# Patient Record
Sex: Male | Born: 1990 | Race: White | Hispanic: No | Marital: Married | Smoking: Current every day smoker
Health system: Southern US, Community
[De-identification: ages and names within clinical notes are randomized; demographics above are authoritative.]

## PROBLEM LIST (undated history)

## (undated) DIAGNOSIS — E119 Type 2 diabetes mellitus without complications: Secondary | ICD-10-CM

## (undated) DIAGNOSIS — I10 Essential (primary) hypertension: Secondary | ICD-10-CM

## (undated) DIAGNOSIS — T8859XA Other complications of anesthesia, initial encounter: Secondary | ICD-10-CM

## (undated) DIAGNOSIS — F419 Anxiety disorder, unspecified: Secondary | ICD-10-CM

## (undated) DIAGNOSIS — H7290 Unspecified perforation of tympanic membrane, unspecified ear: Secondary | ICD-10-CM

## (undated) DIAGNOSIS — E039 Hypothyroidism, unspecified: Secondary | ICD-10-CM

## (undated) HISTORY — PX: DENTAL SURGERY: SHX609

## (undated) HISTORY — DX: Type 2 diabetes mellitus without complications: E11.9

---
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
## 2008-06-13 ENCOUNTER — Emergency Department (HOSPITAL_COMMUNITY): Admission: EM | Admit: 2008-06-13 | Discharge: 2008-06-13 | Payer: Self-pay | Admitting: Emergency Medicine

## 2011-06-30 ENCOUNTER — Encounter: Payer: Self-pay | Admitting: Emergency Medicine

## 2011-06-30 ENCOUNTER — Emergency Department (HOSPITAL_COMMUNITY)
Admission: EM | Admit: 2011-06-30 | Discharge: 2011-06-30 | Disposition: A | Discharge status: Home / Self Care | Payer: Self-pay | Attending: Emergency Medicine | Admitting: Emergency Medicine

## 2011-06-30 DIAGNOSIS — R51 Headache: Secondary | ICD-10-CM | POA: Insufficient documentation

## 2011-06-30 DIAGNOSIS — R110 Nausea: Secondary | ICD-10-CM | POA: Insufficient documentation

## 2011-06-30 DIAGNOSIS — F17200 Nicotine dependence, unspecified, uncomplicated: Secondary | ICD-10-CM | POA: Insufficient documentation

## 2011-06-30 DIAGNOSIS — H9209 Otalgia, unspecified ear: Secondary | ICD-10-CM | POA: Insufficient documentation

## 2011-06-30 DIAGNOSIS — J329 Chronic sinusitis, unspecified: Secondary | ICD-10-CM | POA: Insufficient documentation

## 2011-06-30 HISTORY — DX: Essential (primary) hypertension: I10

## 2011-06-30 MED ORDER — PREDNISONE 10 MG PO TABS: ORAL_TABLET | ORAL | Status: DC

## 2011-06-30 MED ORDER — PSEUDOEPHEDRINE HCL 60 MG PO TABS
60.0000 mg | ORAL_TABLET | Freq: Once | ORAL | Status: AC
  Administered 2011-06-30: 60 mg via ORAL
  Filled 2011-06-30: qty 1

## 2011-06-30 MED ORDER — PREDNISONE 20 MG PO TABS
60.0000 mg | ORAL_TABLET | Freq: Once | ORAL | Status: AC
  Administered 2011-06-30: 60 mg via ORAL
  Filled 2011-06-30: qty 3

## 2011-06-30 MED ORDER — PSEUDOEPHEDRINE HCL 60 MG PO TABS: 60.0000 mg | ORAL_TABLET | Freq: Four times a day (QID) | ORAL | Status: AC | PRN

## 2011-06-30 MED ORDER — HYDROCODONE-ACETAMINOPHEN 5-325 MG PO TABS
ORAL_TABLET | ORAL | Status: DC
Start: 2011-06-30 — End: 2013-04-07

## 2011-06-30 MED ORDER — AMOXICILLIN 500 MG PO CAPS: ORAL_CAPSULE | ORAL | Status: DC

## 2011-06-30 MED ORDER — PENICILLIN V POTASSIUM 250 MG PO TABS
500.0000 mg | ORAL_TABLET | Freq: Once | ORAL | Status: AC
  Administered 2011-06-30: 500 mg via ORAL
  Filled 2011-06-30: qty 2

## 2011-06-30 NOTE — ED Provider Notes (Signed)
History     CSN: [id number]  Arrival date & time 06/30/11  1007   None     Chief Complaint  Patient presents with  . Headache  . Otalgia    (Consider location/radiation/quality/duration/timing/severity/associated sxs/prior treatment) Patient is a 20 y.o. male presenting with headaches and ear pain. The history is provided by the patient.  Headache  This is a new problem. The current episode started more than 2 days ago. The problem occurs constantly. The problem has been gradually worsening. The headache is associated with nothing. The pain is located in the right unilateral region. The quality of the pain is described as sharp and throbbing. The pain is moderate. Associated symptoms include nausea. Pertinent negatives include no fever, no near-syncope, no palpitations and no shortness of breath. He has tried acetaminophen and NSAIDs for the symptoms. The treatment provided no relief.  Otalgia Associated symptoms include headaches. Pertinent negatives include no abdominal pain, no neck pain and no cough.    Past Medical History  Diagnosis Date  . Hypertension     History reviewed. No pertinent past surgical history.  History reviewed. No pertinent family history.  History  Substance Use Topics  . Smoking status: Current Everyday Smoker    Types: Cigarettes  . Smokeless tobacco: Not on file  . Alcohol Use: No      Review of Systems  Constitutional: Negative for fever and activity change.       All ROS Neg except as noted in HPI  HENT: Positive for ear pain. Negative for nosebleeds and neck pain.   Eyes: Negative for photophobia and discharge.  Respiratory: Negative for cough, shortness of breath and wheezing.   Cardiovascular: Negative for chest pain, palpitations and near-syncope.  Gastrointestinal: Positive for nausea. Negative for abdominal pain and blood in stool.  Genitourinary: Negative for dysuria, frequency and hematuria.  Musculoskeletal: Negative for back  pain and arthralgias.  Skin: Negative.   Neurological: Positive for headaches. Negative for dizziness, seizures and speech difficulty.  Psychiatric/Behavioral: Negative for hallucinations and confusion.    Allergies  Review of patient's allergies indicates no known allergies.  Home Medications   Current Outpatient Rx  Name Route Sig Dispense Refill  . ACETAMINOPHEN 500 MG PO TABS Oral Take 1,000 mg by mouth every 6 (six) hours as needed. For pain     . IBUPROFEN 200 MG PO TABS Oral Take 400 mg by mouth every 6 (six) hours as needed. For pain       BP 146/90  Pulse 88  Temp(Src) 98.2 F (36.8 C) (Oral)  Resp 18  Ht 6\' 2"  (1.88 m)  Wt 320 lb (145.151 kg)  BMI 41.09 kg/m2  SpO2 98%  Physical Exam  Nursing note and vitals reviewed. Constitutional: He is oriented to person, place, and time. He appears well-developed and well-nourished.  Non-toxic appearance.  HENT:  Head: Normocephalic.  Right Ear: Tympanic membrane and external ear normal.  Left Ear: Tympanic membrane and external ear normal.       Pain over the right frontal sinus. Mod nasal congestion  Eyes: EOM and lids are normal. Pupils are equal, round, and reactive to light.  Neck: Normal range of motion. Neck supple. Carotid bruit is not present.  Cardiovascular: Normal rate, regular rhythm, normal heart sounds, intact distal pulses and normal pulses.   Pulmonary/Chest: Breath sounds normal. No respiratory distress.  Abdominal: Soft. Bowel sounds are normal. There is no tenderness. There is no guarding.  Musculoskeletal: Normal range of motion.  Lymphadenopathy:       Head (right side): No submandibular adenopathy present.       Head (left side): No submandibular adenopathy present.    He has no cervical adenopathy.  Neurological: He is alert and oriented to person, place, and time. He has normal strength. No cranial nerve deficit or sensory deficit.  Skin: Skin is warm and dry.  Psychiatric: He has a normal mood  and affect. His speech is normal.    ED Course  Procedures (including critical care time)  Labs Reviewed - No data to display No results found.  Pulse oximetry 98% on room air. Within normal limits by my interpretation. Dx: Sinusitis  2. Headache   MDM  I have reviewed nursing notes, vital signs, and all appropriate lab and imaging results for this patient.   Patient complains of headache, right earache, and sinus congestion. His neurologic examination is well within normal limits. He has no temperature elevation pulse rate elevation or evidence of acute abscess in the area. Patient is treated for sinus infection. Prescription for amoxicillin 500 mg daily to some 10 mg, and Norco have been given. Patient to be referred to ear nose and throat physician if this is not effective.     Kathie Dike, [place] 06/30/11 [phone]

## 2011-06-30 NOTE — ED Notes (Signed)
Pt ambulated to BR with steady gate. NAD at this time.

## 2011-06-30 NOTE — ED Notes (Signed)
Pt a/ox4. Resp even and unlabored. NAD at this time. D/C instructions and Rx x4 reviewed with pt. Pt verbalized understanding. Pt ambulated to lobby with steady gate.

## 2011-06-30 NOTE — ED Notes (Signed)
Pt presents with right sided ear pain and headache x 3 days. Pt states he recently had a sinus infection. NAD at this time.

## 2011-06-30 NOTE — ED Notes (Signed)
Pt c/o left ear pain with head pain x 3 days.

## 2011-07-01 NOTE — ED Provider Notes (Signed)
Medical screening examination/treatment/procedure(s) were performed by non-physician practitioner and as supervising physician I was immediately available for consultation/collaboration.    Nelia Shi, MD 07/01/11 2227

## 2013-04-07 ENCOUNTER — Emergency Department (HOSPITAL_COMMUNITY)
Admission: EM | Admit: 2013-04-07 | Discharge: 2013-04-07 | Disposition: A | Discharge status: Home / Self Care | Payer: Self-pay | Attending: Emergency Medicine | Admitting: Emergency Medicine

## 2013-04-07 ENCOUNTER — Encounter (HOSPITAL_COMMUNITY): Payer: Self-pay

## 2013-04-07 DIAGNOSIS — F17200 Nicotine dependence, unspecified, uncomplicated: Secondary | ICD-10-CM | POA: Insufficient documentation

## 2013-04-07 DIAGNOSIS — I10 Essential (primary) hypertension: Secondary | ICD-10-CM | POA: Insufficient documentation

## 2013-04-07 DIAGNOSIS — L03012 Cellulitis of left finger: Secondary | ICD-10-CM

## 2013-04-07 DIAGNOSIS — L03019 Cellulitis of unspecified finger: Secondary | ICD-10-CM | POA: Insufficient documentation

## 2013-04-07 MED ORDER — SULFAMETHOXAZOLE-TRIMETHOPRIM 800-160 MG PO TABS: 1.0000 | ORAL_TABLET | Freq: Two times a day (BID) | ORAL | Status: DC

## 2013-04-07 MED ORDER — LIDOCAINE HCL (PF) 1 % IJ SOLN
INTRAMUSCULAR | Status: AC
  Administered 2013-04-07: 13:00:00 via SUBCUTANEOUS
  Filled 2013-04-07: qty 5

## 2013-04-07 MED ORDER — LIDOCAINE HCL (PF) 1 % IJ SOLN
INTRAMUSCULAR | Status: AC
  Filled 2013-04-07: qty 5

## 2013-04-07 MED ORDER — HYDROCODONE-ACETAMINOPHEN 5-325 MG PO TABS: 1.0000 | ORAL_TABLET | ORAL | Status: DC | PRN

## 2013-04-07 NOTE — ED Provider Notes (Signed)
Medical screening examination/treatment/procedure(s) were performed by non-physician practitioner and as supervising physician I was immediately available for consultation/collaboration.  Case discussed with me.  Shelda Jakes, MD 04/07/13 [phone]

## 2013-04-07 NOTE — ED Notes (Signed)
Pt reports redness and swelling to the end of his left index finger for the past 2 days.  Pt reports severe pain to the area.  Pt denies any fever.

## 2013-04-07 NOTE — ED Provider Notes (Signed)
CSN: [id number]     Arrival date & time 04/07/13  1016 History   First MD Initiated Contact with Patient 04/07/13 1051     Chief Complaint  Patient presents with  . Hand Pain   (Consider location/radiation/quality/duration/timing/severity/associated sxs/prior Treatment) Patient is a 22 y.o. male presenting with hand pain. The history is provided by the patient.  Hand Pain This is a new problem. The current episode started yesterday. The problem occurs constantly. The problem has been gradually worsening. Pertinent negatives include no chills, fever, nausea, neck pain or vomiting. He has tried nothing for the symptoms.   Shawn Griffith is a 22 y.o. male who presents to the ED with pain, redness and swelling to the left index finger. He bites his nails and got a hang nail that got infected. Over the past 2 days the area has gotten much worse and is very painful. He denies fever or other problems.  Past Medical History  Diagnosis Date  . Hypertension    History reviewed. No pertinent past surgical history. No family history on file. History  Substance Use Topics  . Smoking status: Current Every Day Smoker    Types: Cigarettes  . Smokeless tobacco: Not on file  . Alcohol Use: No    Review of Systems  Constitutional: Negative for fever and chills.  HENT: Negative for neck pain.   Gastrointestinal: Negative for nausea and vomiting.  Musculoskeletal:       Left index finger swollen, red and tender.  Skin: Positive for wound.  Allergic/Immunologic: Negative for immunocompromised state.  Psychiatric/Behavioral: The patient is not nervous/anxious.     Allergies  Review of patient's allergies indicates no known allergies.  Home Medications   Current Outpatient Rx  Name  Route  Sig  Dispense  Refill  . acetaminophen (TYLENOL) 500 MG tablet   Oral   Take 1,000 mg by mouth every 6 (six) hours as needed. For pain           BP 154/79  Pulse 64  Temp(Src) 98.2 F (36.8 C)  (Oral)  Resp 16  SpO2 100% Physical Exam  Nursing note and vitals reviewed. Constitutional: He is oriented to person, place, and time. He appears well-developed and well-nourished. No distress.  HENT:  Head: Normocephalic.  Eyes: EOM are normal.  Neck: Neck supple.  Cardiovascular: Normal rate.   Pulmonary/Chest: Effort normal.  Musculoskeletal:       Left hand: He exhibits tenderness, decreased capillary refill and swelling. He exhibits normal range of motion, no deformity and no laceration. Normal sensation noted. Normal strength noted.       Hands: Neurological: He is alert and oriented to person, place, and time. No cranial nerve deficit.  Skin: Skin is warm and dry.  Psychiatric: He has a normal mood and affect. His behavior is normal.    ED Course  Procedures  INCISION AND DRAINAGE Performed by: NEESE,HOPE Consent: Verbal consent obtained. Risks and benefits: risks, benefits and alternatives were discussed Type: abscess  Body area: left index finger  Area cleaned with Betadine  Anesthesia: Digital block  Local anesthetic: lidocaine 1% without epinephrine and Sensorcaine .25 %  Anesthetic total: 4ml  Incision made with # 11 blade  Drainage: purulent  Drainage amount: small  Patient tolerance: Patient tolerated the procedure well with no immediate complications.    MDM  22 y.o. male with paronychia of left index finger, drained without difficulty. Patient to return in 2 days for recheck or sooner if symptoms worsen.  Patient stable for discharge home without any immediate complications. Area does not appear as a felon at this time. Will start antibiotics and pain medication.  Discussed with the patient and all questioned fully answered. He will return immediately  if any problems arise.    Medication List    TAKE these medications       HYDROcodone-acetaminophen 5-325 MG per tablet  Commonly known as:  NORCO/VICODIN  Take 1 tablet by mouth every 4 (four)  hours as needed.     sulfamethoxazole-trimethoprim 800-160 MG per tablet  Commonly known as:  SEPTRA DS  Take 1 tablet by mouth every 12 (twelve) hours.      ASK your doctor about these medications       acetaminophen 500 MG tablet  Commonly known as:  TYLENOL  Take 1,000 mg by mouth every 6 (six) hours as needed. For pain           Shawn Napoleon, NP 04/07/13 1735

## 2013-04-08 NOTE — Progress Notes (Signed)
ED/CM noted patient did not have health insurance and/or PCP listed in the computer.  Patient was given the [place] resource handout with information on the clinics, food pantries, and the handout for new health insurance sign-up.  Patient expressed appreciation for this. 

## 2013-12-25 ENCOUNTER — Encounter (HOSPITAL_COMMUNITY): Payer: Self-pay | Admitting: Emergency Medicine

## 2013-12-25 ENCOUNTER — Emergency Department (HOSPITAL_COMMUNITY)
Admission: EM | Admit: 2013-12-25 | Discharge: 2013-12-25 | Disposition: A | Discharge status: Home / Self Care | Payer: Self-pay | Attending: Emergency Medicine | Admitting: Emergency Medicine

## 2013-12-25 DIAGNOSIS — H6501 Acute serous otitis media, right ear: Secondary | ICD-10-CM

## 2013-12-25 DIAGNOSIS — R259 Unspecified abnormal involuntary movements: Secondary | ICD-10-CM | POA: Insufficient documentation

## 2013-12-25 DIAGNOSIS — I10 Essential (primary) hypertension: Secondary | ICD-10-CM | POA: Insufficient documentation

## 2013-12-25 DIAGNOSIS — J020 Streptococcal pharyngitis: Secondary | ICD-10-CM

## 2013-12-25 DIAGNOSIS — H6500 Acute serous otitis media, unspecified ear: Secondary | ICD-10-CM | POA: Insufficient documentation

## 2013-12-25 DIAGNOSIS — F17200 Nicotine dependence, unspecified, uncomplicated: Secondary | ICD-10-CM | POA: Insufficient documentation

## 2013-12-25 DIAGNOSIS — J029 Acute pharyngitis, unspecified: Secondary | ICD-10-CM | POA: Insufficient documentation

## 2013-12-25 HISTORY — DX: Unspecified perforation of tympanic membrane, unspecified ear: H72.90

## 2013-12-25 LAB — RAPID STREP SCREEN (MED CTR MEBANE ONLY): STREPTOCOCCUS, GROUP A SCREEN (DIRECT): POSITIVE — AB

## 2013-12-25 MED ORDER — HYDROCODONE-ACETAMINOPHEN 5-325 MG PO TABS: 1.0000 | ORAL_TABLET | ORAL | Status: DC | PRN

## 2013-12-25 MED ORDER — HYDROCODONE-ACETAMINOPHEN 5-325 MG PO TABS
1.0000 | ORAL_TABLET | Freq: Once | ORAL | Status: AC
  Administered 2013-12-25: 1 via ORAL
  Filled 2013-12-25: qty 1

## 2013-12-25 MED ORDER — DEXAMETHASONE SODIUM PHOSPHATE 4 MG/ML IJ SOLN
8.0000 mg | Freq: Once | INTRAMUSCULAR | Status: AC
  Administered 2013-12-25: 8 mg via INTRAVENOUS
  Filled 2013-12-25: qty 2

## 2013-12-25 MED ORDER — PENICILLIN G BENZATHINE 1200000 UNIT/2ML IM SUSP
1.2000 10*6.[IU] | Freq: Once | INTRAMUSCULAR | Status: AC
  Administered 2013-12-25: 1.2 10*6.[IU] via INTRAMUSCULAR
  Filled 2013-12-25: qty 2

## 2013-12-25 NOTE — ED Provider Notes (Signed)
CSN: [id number]     Arrival date & time 12/25/13  1530 History   First MD Initiated Contact with Patient 12/25/13 1547     Chief Complaint  Patient presents with  . Sore Throat  . Otalgia     (Consider location/radiation/quality/duration/timing/severity/associated sxs/prior Treatment) Patient is a 23 y.o. male presenting with pharyngitis and ear pain. The history is provided by the patient.  Sore Throat This is a new problem. The current episode started in the past 7 days. The problem occurs constantly. The problem has been gradually worsening. Associated symptoms include chills, headaches, myalgias, a sore throat and swollen glands. Pertinent negatives include no abdominal pain, chest pain, congestion, coughing, fever, nausea, rash, urinary symptoms, visual change or vomiting. He has tried acetaminophen for the symptoms. The treatment provided no relief.  Otalgia Associated symptoms: headaches and sore throat   Associated symptoms: no abdominal pain, no congestion, no cough, no diarrhea, no fever, no rash and no vomiting    Shawn Griffith is a 23 y.o. male who presents to the ED with sore throat and ear pain that started 3 days ago. The pain has gotten worse. The ear pain is on the right. He states it hurts so bad it makes his head hurt.   Past Medical History  Diagnosis Date  . Hypertension   . Ruptured ear drum     right   History reviewed. No pertinent past surgical history. History reviewed. No pertinent family history. History  Substance Use Topics  . Smoking status: Current Every Day Smoker -- 0.50 packs/day for 15 years    Types: Cigarettes  . Smokeless tobacco: Never Used  . Alcohol Use: No    Review of Systems  Constitutional: Positive for chills. Negative for fever.  HENT: Positive for ear pain and sore throat. Negative for congestion.   Eyes: Negative for visual disturbance.  Respiratory: Negative for cough.   Cardiovascular: Negative for chest pain.   Gastrointestinal: Negative for nausea, vomiting, abdominal pain and diarrhea.  Genitourinary: Negative for dysuria, urgency and frequency.  Musculoskeletal: Positive for myalgias.  Skin: Negative for rash.  Neurological: Positive for headaches.  Psychiatric/Behavioral: Negative for confusion. The patient is not nervous/anxious.       Allergies  Review of patient's allergies indicates no known allergies.  Home Medications   Prior to Admission medications   Medication Sig Start Date End Date Taking? Authorizing Miu Chiong  acetaminophen (TYLENOL) 500 MG tablet Take 1,000 mg by mouth every 6 (six) hours as needed. For pain     Historical Daishaun Ayre, MD  HYDROcodone-acetaminophen (NORCO/VICODIN) 5-325 MG per tablet Take 1 tablet by mouth every 4 (four) hours as needed. 04/07/13   Hope Orlene OchM Neese, NP  sulfamethoxazole-trimethoprim (SEPTRA DS) 800-160 MG per tablet Take 1 tablet by mouth every 12 (twelve) hours. 04/07/13   Hope Orlene OchM Neese, NP   BP 160/94  Pulse 107  Temp(Src) 98.1 F (36.7 C) (Oral)  Resp 20  Ht 6\' 3"  (1.905 m)  Wt 330 lb (149.687 kg)  BMI 41.25 kg/m2  SpO2 98% Physical Exam  Nursing note and vitals reviewed. Constitutional: He is oriented to person, place, and time. He appears well-developed and well-nourished.  HENT:  Head: Normocephalic.  Right Ear: Tympanic membrane is bulging.  Left Ear: Tympanic membrane normal.  Nose: Rhinorrhea present.  Mouth/Throat: Uvula is midline and mucous membranes are normal. Posterior oropharyngeal erythema present.  Tonsils enlarged and swollen, almost touching.   Eyes: EOM are normal.  Neck: Neck supple.  Cardiovascular: Regular rhythm.  Tachycardia present.   Pulmonary/Chest: Effort normal and breath sounds normal.  Abdominal: Soft. There is no tenderness.  Musculoskeletal: Normal range of motion.  Neurological: He is alert and oriented to person, place, and time. No cranial nerve deficit.  Skin: Skin is warm and dry.  Psychiatric:  He has a normal mood and affect. His behavior is normal.   Results for orders placed during the hospital encounter of 12/25/13 (from the past 24 hour(s))  RAPID STREP SCREEN     Status: Abnormal   Collection Time    12/25/13  3:45 PM      Result Value Ref Range   Streptococcus, Group A Screen (Direct) POSITIVE (*) NEGATIVE    ED Course  Procedures Decadron 8 mg IM, Bicillin 1.2 M/U IM MDM  23 y.o. male with sore throat, right ear pain and feeling bad x 3 days. Will treat for strep with Bicillin 1.2 million units IM, will give Decadron 8 mg. IM to help decrease the swelling of his tonsils. Will treat with pain medications. Offered patient liquid pain medication but he request pill because liquid medication makes him gag. Stable for discharge. Able to swallow pill without difficulty. I have reviewed this patient's vital signs, nurses notes, appropriate labs and discussed findings and plan of care with the patient and he voices understanding and agrees with plan.     [address]Hope BluewellM Neese, [place] 12/25/13 [id number]

## 2013-12-25 NOTE — ED Provider Notes (Signed)
Medical screening examination/treatment/procedure(s) were performed by non-physician practitioner and as supervising physician I was immediately available for consultation/collaboration.   EKG Interpretation None      Devoria AlbeIva Knapp, MD, Armando GangFACEP   Ward GivensIva L Knapp, MD 12/25/13 [phone]

## 2013-12-25 NOTE — ED Notes (Signed)
Pt c/o sore throat that "has spread to ears". Also c/o feeling like his throat is closing. Denies any fevers.

## 2013-12-25 NOTE — Discharge Instructions (Signed)
Take ibuprofen in addition to the narcotic pain medication. Return for worsening symptoms.

## 2013-12-25 NOTE — ED Notes (Signed)
Patient c/o sore throat x3 days progressively. Patient also c/o right ear ache. Patient states "It hurts so much it makes my head tender to touch." Patient denies any fevers or drainage from ear.

## 2017-08-10 ENCOUNTER — Emergency Department (HOSPITAL_COMMUNITY)
Admission: EM | Admit: 2017-08-10 | Discharge: 2017-08-10 | Disposition: A | Discharge status: Home / Self Care | Payer: Self-pay | Attending: Emergency Medicine | Admitting: Emergency Medicine

## 2017-08-10 ENCOUNTER — Emergency Department (HOSPITAL_COMMUNITY): Payer: Self-pay

## 2017-08-10 ENCOUNTER — Encounter (HOSPITAL_COMMUNITY): Payer: Self-pay | Admitting: Emergency Medicine

## 2017-08-10 DIAGNOSIS — J039 Acute tonsillitis, unspecified: Secondary | ICD-10-CM | POA: Insufficient documentation

## 2017-08-10 DIAGNOSIS — F17210 Nicotine dependence, cigarettes, uncomplicated: Secondary | ICD-10-CM | POA: Insufficient documentation

## 2017-08-10 DIAGNOSIS — J029 Acute pharyngitis, unspecified: Secondary | ICD-10-CM | POA: Insufficient documentation

## 2017-08-10 DIAGNOSIS — I10 Essential (primary) hypertension: Secondary | ICD-10-CM | POA: Insufficient documentation

## 2017-08-10 LAB — RAPID URINE DRUG SCREEN, HOSP PERFORMED
Amphetamines: NOT DETECTED
Barbiturates: NOT DETECTED
Benzodiazepines: NOT DETECTED
Cocaine: NOT DETECTED
Opiates: POSITIVE — AB
Tetrahydrocannabinol: POSITIVE — AB

## 2017-08-10 LAB — SALICYLATE LEVEL

## 2017-08-10 LAB — CBC
HEMATOCRIT: 43.5 % (ref 39.0–52.0)
HEMOGLOBIN: 13.2 g/dL (ref 13.0–17.0)
MCH: 25.9 pg — ABNORMAL LOW (ref 26.0–34.0)
MCHC: 30.3 g/dL (ref 30.0–36.0)
MCV: 85.5 fL (ref 78.0–100.0)
Platelets: 421 10*3/uL — ABNORMAL HIGH (ref 150–400)
RBC: 5.09 MIL/uL (ref 4.22–5.81)
RDW: 14.8 % (ref 11.5–15.5)
WBC: 13.5 10*3/uL — ABNORMAL HIGH (ref 4.0–10.5)

## 2017-08-10 LAB — ACETAMINOPHEN LEVEL

## 2017-08-10 LAB — COMPREHENSIVE METABOLIC PANEL
ALBUMIN: 3.7 g/dL (ref 3.5–5.0)
ALK PHOS: 86 U/L (ref 38–126)
ALT: 42 U/L (ref 17–63)
AST: 42 U/L — AB (ref 15–41)
Anion gap: 11 (ref 5–15)
BUN: 8 mg/dL (ref 6–20)
CALCIUM: 9.1 mg/dL (ref 8.9–10.3)
CO2: 23 mmol/L (ref 22–32)
Chloride: 101 mmol/L (ref 101–111)
Creatinine, Ser: 0.66 mg/dL (ref 0.61–1.24)
GFR calc Af Amer: >60 mL/min (ref >60)
GFR calc non Af Amer: >60 mL/min (ref >60)
GLUCOSE: 106 mg/dL — AB (ref 65–99)
Potassium: 4.1 mmol/L (ref 3.5–5.1)
Sodium: 135 mmol/L (ref 135–145)
Total Bilirubin: 0.6 mg/dL (ref 0.3–1.2)
Total Protein: 8 g/dL (ref 6.5–8.1)

## 2017-08-10 LAB — RAPID STREP SCREEN (MED CTR MEBANE ONLY): STREPTOCOCCUS, GROUP A SCREEN (DIRECT): NEGATIVE

## 2017-08-10 MED ORDER — IOPAMIDOL (ISOVUE-300) INJECTION 61%
75.0000 mL | Freq: Once | INTRAVENOUS | Status: AC | PRN
  Administered 2017-08-10: 75 mL via INTRAVENOUS

## 2017-08-10 MED ORDER — AMOXICILLIN-POT CLAVULANATE 875-125 MG PO TABS: 1.0000 | ORAL_TABLET | Freq: Two times a day (BID) | ORAL | 0 refills | Status: AC

## 2017-08-10 MED ORDER — IBUPROFEN 600 MG PO TABS: 600.0000 mg | ORAL_TABLET | Freq: Four times a day (QID) | ORAL | 0 refills | Status: DC | PRN

## 2017-08-10 MED ORDER — KETOROLAC TROMETHAMINE 30 MG/ML IJ SOLN
15.0000 mg | Freq: Once | INTRAMUSCULAR | Status: AC
  Administered 2017-08-10: 15 mg via INTRAVENOUS
  Filled 2017-08-10: qty 1

## 2017-08-10 MED ORDER — AMOXICILLIN-POT CLAVULANATE 875-125 MG PO TABS
1.0000 | ORAL_TABLET | Freq: Once | ORAL | Status: AC
  Administered 2017-08-10: 1 via ORAL
  Filled 2017-08-10: qty 1

## 2017-08-10 NOTE — ED Provider Notes (Signed)
[facility] EMERGENCY DEPARTMENT Provider Note   CSN: [id number] Arrival date & time: 08/10/17  0801  History   Chief Complaint Chief Complaint  Patient presents with  . Sore Throat    HPI Shawn Griffith is a 27 y.o. male.  HPI   27 y/o M who presents to the ED c/o a constant 7/10 sore throat that began about 1 month ago. It has been progressively worsening since then. States he lost his voice 2 weeks ago and has been hoarse since. Has had painful swallowing, but has been able to tolerate secretions and swallow food and water. He feels that his tonsils are swollen, and has pain in tonsils bilat R>L. Also with R ear pain. Has been snorting Tylenol about 6-7 pills per day for the last year for nasal pain but has increased use recently due to throat pain. States that some days he snorts Tylenol every hour. States he last took liquid tylenol 1 hour PTA.  Also states he has been snorting hydrocodone as well. He denies any IV drug use, but endorses marijuana use.  Also reports a productive cough with green sputum and a frontal HA. Denies fevers, hemoptysis, wheezing,  photophobia, neck pain/stiffness, chest pain, abd pain, nausea, vomiting, or constipation. Reports chronic SOB that remains unchanged today. No swelling, redness, or pain to BLE. No recent surgeries or periods or immobility.  Uses tobacco. States he was diagnosed with HTN, but ran out of his medications a few months ago.   Past Medical History:  Diagnosis Date  . Hypertension   . Ruptured ear drum    right    There are no active problems to display for this patient.   History reviewed. No pertinent surgical history.     Home Medications    Prior to Admission medications   Medication Sig Start Date End Date Taking? Authorizing Provider  acetaminophen (TYLENOL) 500 MG tablet Take 1,500 mg by mouth every 8 (eight) hours as needed for moderate pain. For pain   Yes [provider]  amoxicillin-clavulanate  (AUGMENTIN) 875-125 MG tablet Take 1 tablet by mouth 2 (two) times daily for 10 days. 08/10/17 08/20/17  Nyeshia Mysliwiec S, PA-C  HYDROcodone-acetaminophen (NORCO/VICODIN) 5-325 MG per tablet Take 1 tablet by mouth every 4 (four) hours as needed. Patient not taking: Reported on 08/10/2017 12/25/13   Janne Napoleon, NP  ibuprofen (ADVIL,MOTRIN) 600 MG tablet Take 1 tablet (600 mg total) by mouth every 6 (six) hours as needed. 08/10/17   Cloris Flippo S, PA-C    Family History History reviewed. No pertinent family history.  Social History Social History   Tobacco Use  . Smoking status: Current Every Day Smoker    Packs/day: 0.50    Years: 15.00    Pack years: 7.50    Types: Cigarettes  . Smokeless tobacco: Never Used  Substance Use Topics  . Alcohol use: No  . Drug use: Yes    Types: Marijuana     Allergies   Patient has no known allergies.   Review of Systems Review of Systems  Constitutional: Negative for chills and fever.  HENT: Positive for ear pain, sore throat and voice change.        Painful swallowing  Eyes: Negative for visual disturbance.  Respiratory: Positive for cough and shortness of breath (chronic unchanged). Negative for wheezing.   Cardiovascular: Negative for chest pain and palpitations.  Gastrointestinal: Negative for abdominal pain, constipation, diarrhea, nausea and vomiting.  Genitourinary: Negative for frequency  and hematuria.  Musculoskeletal: Negative for neck pain and neck stiffness.       No calf swelling redness or pain  Skin: Negative for color change and rash.  Neurological: Positive for headaches.     Physical Exam Updated Vital Signs BP (!) 157/89 (BP Location: Right Arm)   Pulse (!) 108   Temp (!) 97.5 F (36.4 C) (Oral)   Resp (!) 21   Ht 6\' 3"  (1.905 m)   Wt (!) 158.8 kg (350 lb)   SpO2 99%   BMI 43.75 kg/m   Physical Exam  Constitutional: He appears well-developed and well-nourished.  No obvious distress.  HENT:  Head:  Normocephalic and atraumatic.  Right Ear: Hearing normal.  Left Ear: Hearing normal.  Mouth/Throat: Uvula is midline, oropharynx is clear and moist and mucous membranes are normal.  Right TMs normal bilat with some scarring to the right TM. No erythema. No trismus. Right tonsillar swelling with no exudates. Uvula midline. TTP of right tonsil. No obvious peritonsillar abscess.  No nasal septum.  White powder noted to bilat nares.  Patient hoarse but is comfortable laying back in bed.  Tolerating secretions.  Eyes: Conjunctivae and EOM are normal. Pupils are equal, round, and reactive to light.  Neck: Normal range of motion. Neck supple.  TTP to the right preauricular and submandibular area. Not able to appreciate cervical lymphadenopathy secondary to body habitus. Pt has FROM of the neck.  No nuchal rigidity.  Cardiovascular: Normal rate, regular rhythm, normal heart sounds and intact distal pulses.  No murmur heard. Pulmonary/Chest: Effort normal and breath sounds normal. No respiratory distress. He has no wheezes.  Abdominal: Soft. Bowel sounds are normal. There is no tenderness. There is no guarding.  Musculoskeletal: He exhibits no edema.  Neurological: He is alert.  Skin: Skin is warm and dry.  Psychiatric: He has a normal mood and affect.  Nursing note and vitals reviewed.    ED Treatments / Results  Labs (all labs ordered are listed, but only abnormal results are displayed) Labs Reviewed  ACETAMINOPHEN LEVEL - Abnormal; Notable for the following components:      Result Value   Acetaminophen (Tylenol), Serum <10 (*)    All other components within normal limits  CBC - Abnormal; Notable for the following components:   WBC 13.5 (*)    MCH 25.9 (*)    Platelets 421 (*)    All other components within normal limits  COMPREHENSIVE METABOLIC PANEL - Abnormal; Notable for the following components:   Glucose, Bld 106 (*)    AST 42 (*)    All other components within normal limits    RAPID URINE DRUG SCREEN, HOSP PERFORMED - Abnormal; Notable for the following components:   Opiates POSITIVE (*)    Tetrahydrocannabinol POSITIVE (*)    All other components within normal limits  RAPID STREP SCREEN (NOT AT [facility])  CULTURE, GROUP A STREP [facility])  SALICYLATE LEVEL    EKG  EKG Interpretation  Date/Time:  Friday August 10 2017 09:57:43 EST Ventricular Rate:  101 PR Interval:    QRS Duration: 98 QT Interval:  332 QTC Calculation: 431 R Axis:   59 Text Interpretation:  Sinus tachycardia ECG OTHERWISE WITHIN NORMAL LIMITS Confirmed by Eber Hong ([id number]) on 08/10/2017 10:27:33 AM       Radiology Dg Chest 2 View  Result Date: 08/10/2017 CLINICAL DATA:  Shortness of breath and chest pain EXAM: CHEST  2 VIEW COMPARISON:  12/13/2003. FINDINGS: Cardiopericardial silhouette is at  upper limits of normal for size. The lungs are clear without focal pneumonia, edema, pneumothorax or pleural effusion. The visualized bony structures of the thorax are intact. IMPRESSION: No active cardiopulmonary disease. Electronically Signed   By: Kennith CenterEric  Mansell M.D.   On: 08/10/2017 09:48   Ct Soft Tissue Neck W Contrast  Result Date: 08/10/2017 CLINICAL DATA:  Sore throat over the last month with hoarseness over the last 2 weeks. Difficulty swallowing. EXAM: CT NECK WITH CONTRAST TECHNIQUE: Multidetector CT imaging of the neck was performed using the standard protocol following the bolus administration of intravenous contrast. CONTRAST:  75mL ISOVUE-300 IOPAMIDOL (ISOVUE-300) INJECTION 61% COMPARISON:  04/08/2004 FINDINGS: Pharynx and larynx: Bilateral symmetric tonsillar prominence which could go along with tonsillitis. No evidence of tonsillar or peritonsillar abscess. There is slight thickening of the pharyngeal soft tissues as well consistent with pharyngitis. There may be a small retropharyngeal effusion. No focal collection to suggest retropharyngeal abscess. No sign of mucosal or submucosal  mass. Salivary glands: Parotid and submandibular glands are symmetric and within normal limits. Thyroid: Normal Lymph nodes: No enlarged or low-density nodes on either side of the neck. Symmetric prominent level 2 nodes are upper limits of normal. Vascular: No abnormal vascular finding. Limited intracranial: Normal Visualized orbits: Not included Mastoids and visualized paranasal sinuses: Inflammatory changes of the paranasal sinuses, particularly the maxillary sinuses. Skeleton: No bone abnormality seen. Upper chest: Negative Other: None IMPRESSION: Bilateral symmetric tonsillar prominence suggesting tonsillitis. No evidence of tonsillar or peritonsillar abscess. Slight thickening of the pharyngeal soft tissues as well consistent with pharyngitis. There may be a small retropharyngeal effusion. No sign of mass lesion. Slightly prominent but upper limits of normal level 2 nodes, which could be reactive to the inflammatory changes. No suppuration. Electronically Signed   By: Paulina FusiMark  Shogry M.D.   On: 08/10/2017 09:58    Procedures Procedures (including critical care time)  Medications Ordered in ED Medications  iopamidol (ISOVUE-300) 61 % injection 75 mL (75 mLs Intravenous Contrast Given 08/10/17 0936)  ketorolac (TORADOL) 30 MG/ML injection 15 mg (15 mg Intravenous Given 08/10/17 1047)  amoxicillin-clavulanate (AUGMENTIN) 875-125 MG per tablet 1 tablet (1 tablet Oral Given 08/10/17 1049)     Initial Impression / Assessment and Plan / ED Course  I have reviewed the triage vital signs and the nursing notes.  Pertinent labs & imaging results that were available during my care of the patient were reviewed by me and considered in my medical decision making (see chart for details).  Discussed pt presentation and exam findings with Dr. Hyacinth Meekermiller, who evaluated the patient and agrees with the plan to obtain CT neck to rule out peritonsillar or retropharyngeal abscess.  Also agrees with plan for labs and advised to  contact poison control to follow the recommendations.  Later discussed imaging and lab results and Dr. Hyacinth MeekerMiller agrees with the plan for discharge on Augmentin for 10 days.  First dose will be given in the ED.   9:09 AM contacted poison control and [facility] staff.  She recommended continuous cardiac monitoring, EKG, tylenol, ASA level, CMP.  Requested that I contact her after results of these come back for further recommendations.  12:03 PM [facility] who reviewed the labs, UDS, EKG, chest x-ray, and she has no further recommendations from my toxic allergy standpoint.  1219 Rechecked pt. NAD laying in bed. HR 98 on manual recheck.  Discussed results of all labs and imaging studies.  Discussed  plan for discharge on antibiotics with close outpatient follow-up.  Strict return precautions were given.   Final Clinical Impressions(s) / ED Diagnoses   Final diagnoses:  Pharyngitis, unspecified etiology  Tonsillitis   27 year old male presenting to the ED with sore throat hoarseness times 2 weeks.  Concern for peritonsillar abscess or retropharyngeal abscess so CT of the neck was ordered with IV contrast.  Also concern for possible acetaminophen toxicity so labs were drawn as well as, UDS, chest x-ray and EKG. CBC with mildly elevated white count to 13.5.  CMP grossly normal with mildly elevated glucose and AST of 42.  Not concerning for acute liver failure.  Poison control was contacted and recommendations followed, cleared from toxicology standpoint.  UDS positive for opiates and marijuana.  Acetaminophen level and salicylate level negative.  Strep negative and culture sent.   CT showed bilateral tonsillar prominence consistent with tonsillitis.  Slight thickening of pharyngeal soft tissues consistent with pharyngitis.  No evidence of tonsillar or peritonsillar abscess.  Small retropharyngeal effusion without evidence of retropharyngeal abscess.   Antibiotics were initiated given pharyngitis, tonsillitis, and small effusion.  Patient is in no respiratory distress and is tolerating secretions.  Has no obstruction of his airway.  Was initially mildly tachycardic likely secondary to pain, however on my manual exam heart rate 98.  ECG was negative for ST elevations or depressions.  Normal intervals.  Sinus tach noted.  Chest x-ray negative for infiltrates.  Patient nontoxic-appearing and afebrile.  Given strict instructions to take the full course of antibiotics and return if any difficulty swallowing, shortness of breath, fevers, or any new worsening symptoms.  Advised to follow-up with primary care for resolution of symptoms as well as to recheck blood pressure and restart medications.   ED Discharge Orders        Ordered    amoxicillin-clavulanate (AUGMENTIN) 875-125 MG tablet  2 times daily     08/10/17 1138    ibuprofen (ADVIL,MOTRIN) 600 MG tablet  Every 6 hours PRN     08/10/17 1221       Diani Jillson S, PA-C 08/10/17 1911    Eber Hong, MD 08/12/17 [phone]

## 2017-08-10 NOTE — Discharge Instructions (Signed)
You were given a prescription for antibiotics. Please take the antibiotic prescription fully.  You may take over-the-counter ibuprofen for pain.  You need to follow-up with the primary care doctor within 5-7 days to check improvement of your symptoms.  If your symptoms are worsening any start to experience fevers, difficulty swallowing, worsening pain, chest pain, shortness of breath you need to return to the ER immediately.

## 2017-08-10 NOTE — ED Triage Notes (Signed)
Pt reports a sore throat for a month with hoarseness x 2 weeks.  States it is becoming difficult to swallow and is difficult to swallow spit at times.

## 2017-08-10 NOTE — ED Notes (Signed)
Pt ambulating to BR °

## 2017-08-10 NOTE — ED Provider Notes (Signed)
The patient is an obese 27 year old male who reports that after using cocaine about a year ago by snorting he developed some chronic nasal pain, he states that he only did this very rarely but after the last time he used he developed more of a chronic pain in the nose which he states only gets better when he snorts Tylenol.  On further questioning and prodding he does endorse that he actually uses hydrocodone from time to time as well.  He presents today because over the last month he has had increasing pain in his throat with hoarseness as well.  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  His oropharynx is clear, moist, his tonsils are symmetrical without any asymmetry or uvular deviation, no visible exudate or abscess.  The palate appears normal, the voice is hoarse but he is tolerating secretions well and is able to lay supine.  Chest exam is unremarkable with no murmurs, clear lungs, he is able to speak in full sentences  The patient has been using Tylenol much too frequently, will need discussed this with poison control, he states that sometimes he uses 1 tablet an hour to help with the pain.  I suspect that there is polypharmacy going on and this is more than just Tylenol given the total absence of the nasal septum.  Will also check liver function, acetaminophen level, consult with poison control, CT scan of the neck to rule out deeper tissue infection such as retropharyngeal abscess or peritonsillar though this is less likely.  Medical screening examination/treatment/procedure(s) were conducted as a shared visit with non-physician practitioner(s) and myself.  I personally evaluated the patient during the encounter.  Clinical Impression:   Final diagnoses:  Pharyngitis, unspecified etiology  Tonsillitis         Eber HongMiller, Jamillia Closson, MD 08/12/17 [phone]

## 2017-08-10 NOTE — ED Notes (Signed)
Patient transported to CT 

## 2017-08-12 LAB — CULTURE, GROUP A STREP (THRC)

## 2019-10-29 IMAGING — CT CT NECK W/ CM
4 of 5 series · 15 of 33 positions shown, 18 images · IV contrast (iopamidol)
Comparison: 04/08/2004

CLINICAL DATA: Sore throat over the last month with hoarseness over
the last 2 weeks. Difficulty swallowing.

EXAM:
CT NECK WITH CONTRAST
TECHNIQUE: Multidetector CT imaging of the neck was performed using the
standard protocol following the bolus administration of intravenous
contrast.
CONTRAST:  75mL [id number]-KUU IOPAMIDOL (IOZL3F-KUU) INJECTION 61%

[Series 2: axial neck · axial · 0.54mm/px · z∈[-70,+84]mm · 5 of 117 slices shown, 7 images]
[im 20/117  soft-tissue]
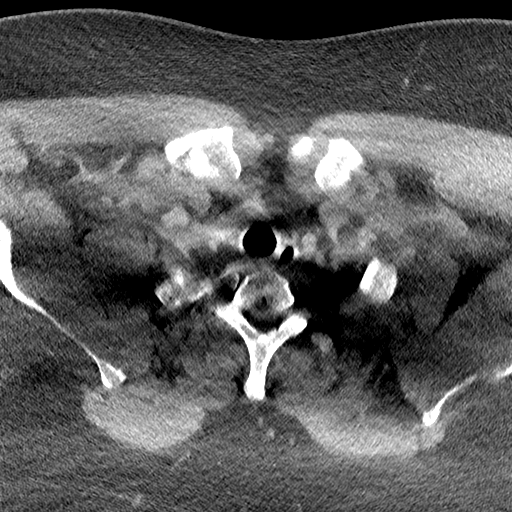
[im 20/117  bone]
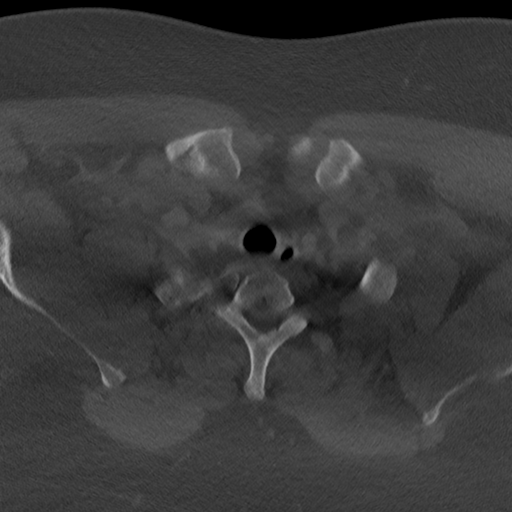
[im 39/117  bone]
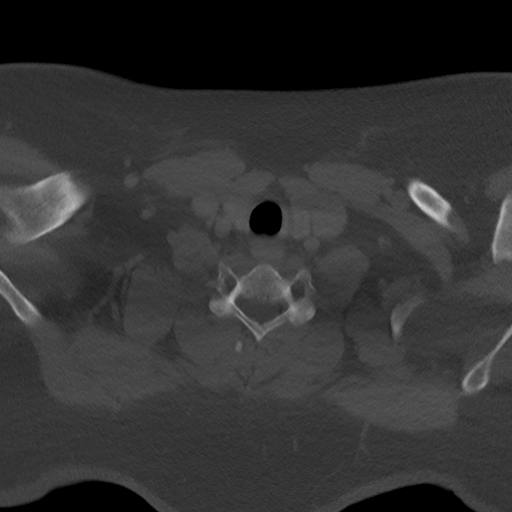
[im 59/117  bone]
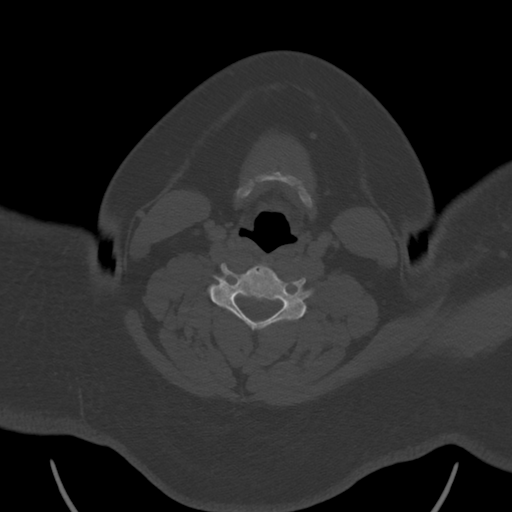
[im 78/117  bone]
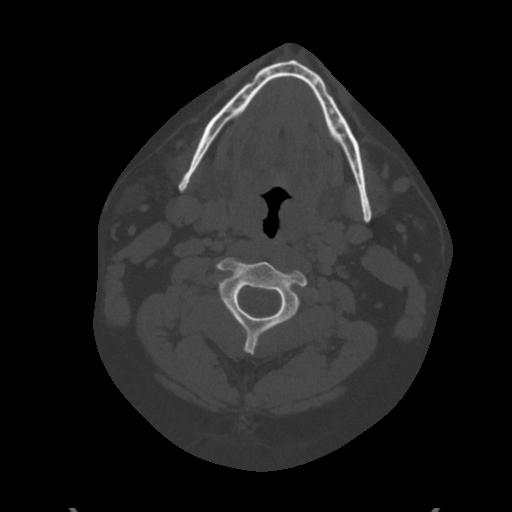
[im 97/117  soft-tissue]
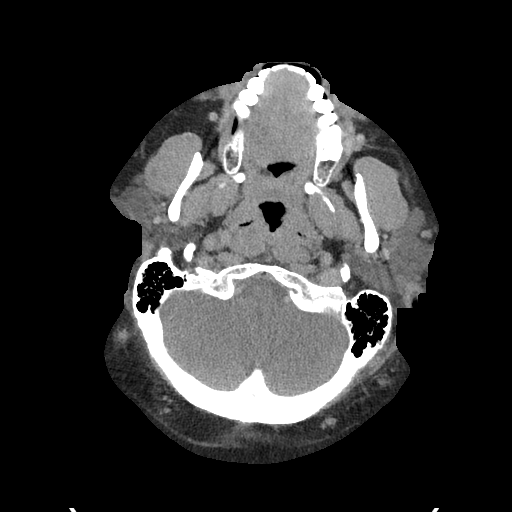
[im 97/117  bone]
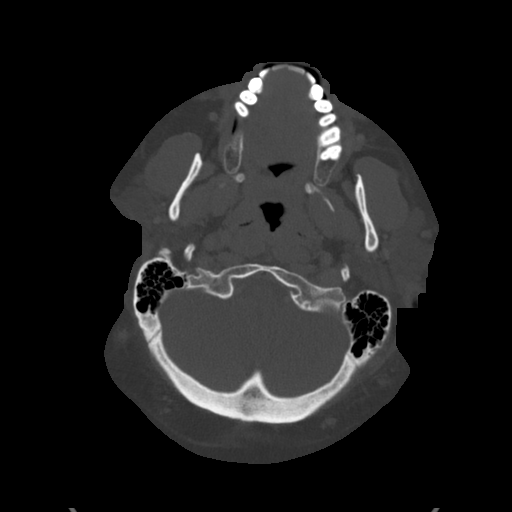

[Series 6: coronal neck · coronal · 0.46mm/px · 3 of 122 slices shown]
[im 25/122  bone]
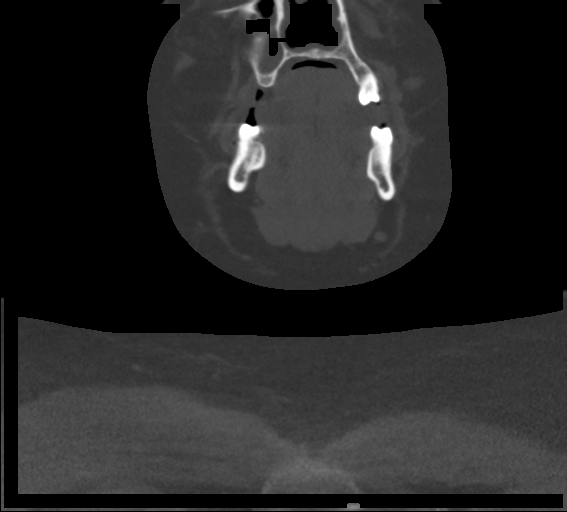
[im 49/122  bone]
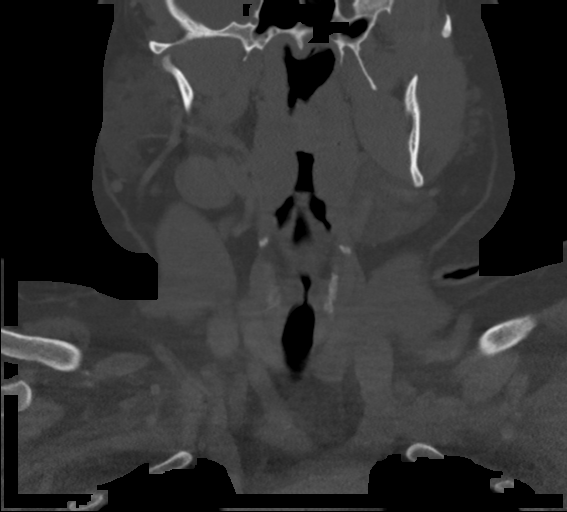
[im 73/122  bone]
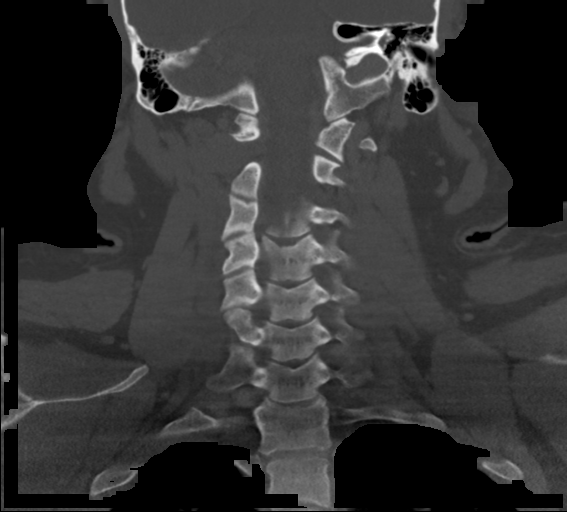

[Series 7: sagittal neck · sagittal · 0.46mm/px · 5 of 112 slices shown, 6 images]
[im 38/112  bone]
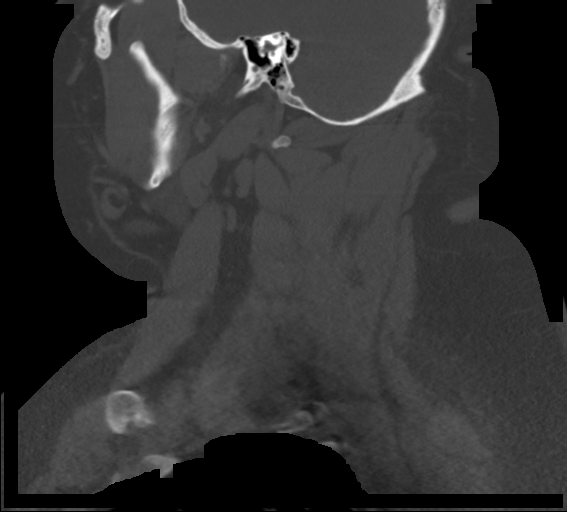
[im 47/112  bone]
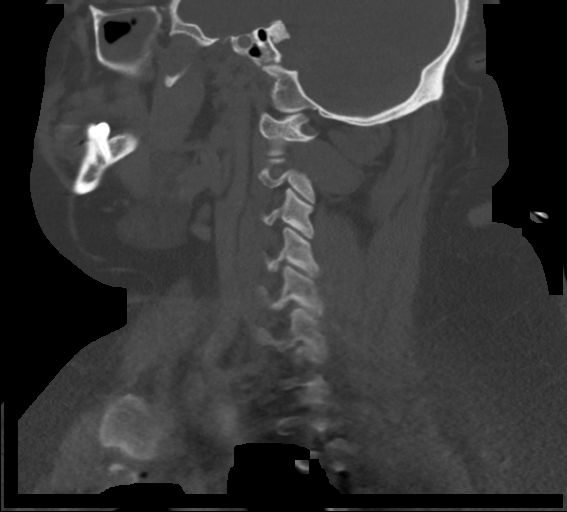
[im 56/112  soft-tissue]
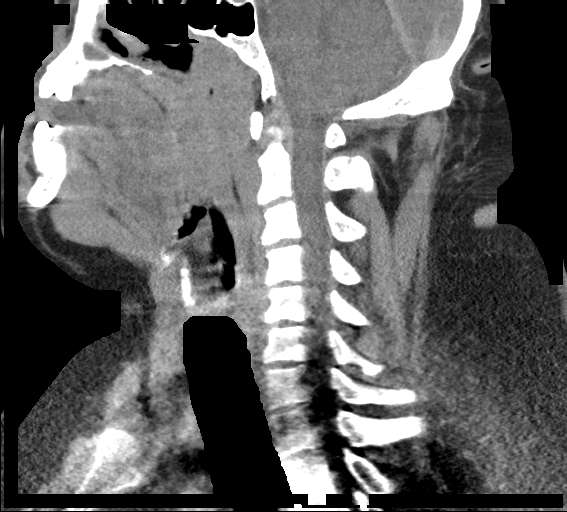
[im 56/112  bone]
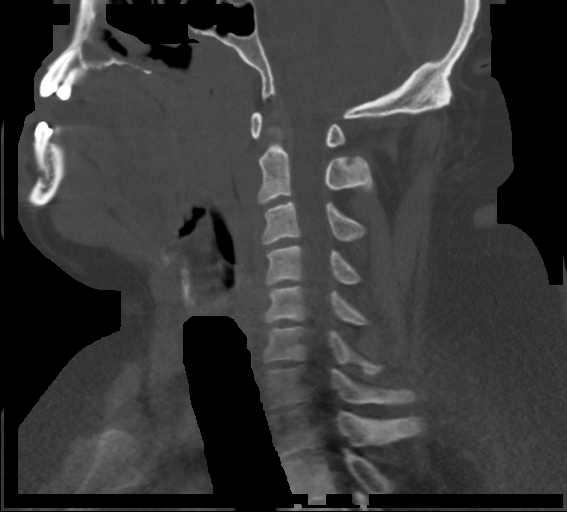
[im 65/112  bone]
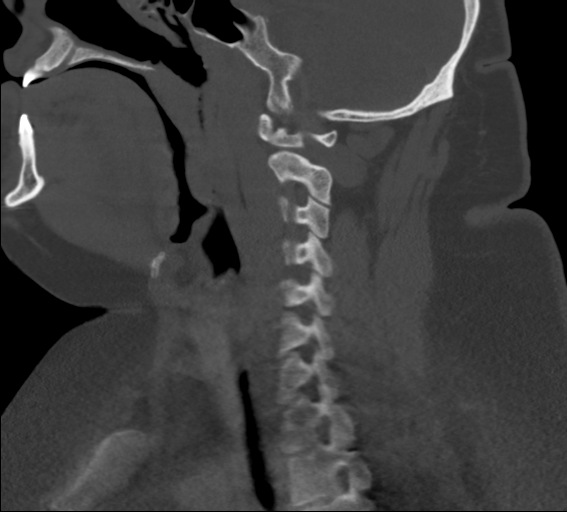
[im 75/112  bone]
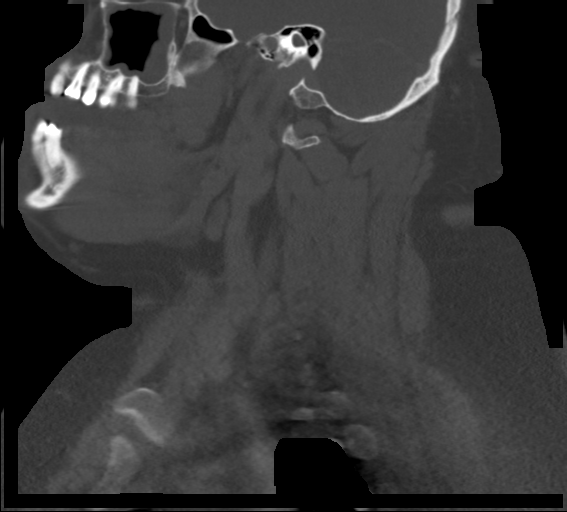

[Series 8: orthogonal ax · axial · 0.55mm/px · z∈[-59,-13]mm · 2 of 114 slices shown]
[im 23/114  bone]
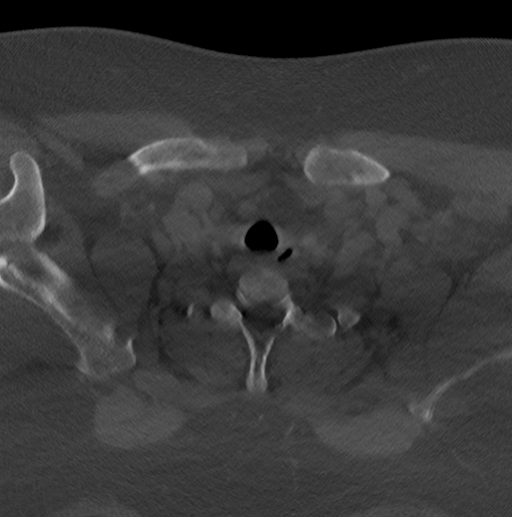
[im 46/114  bone]
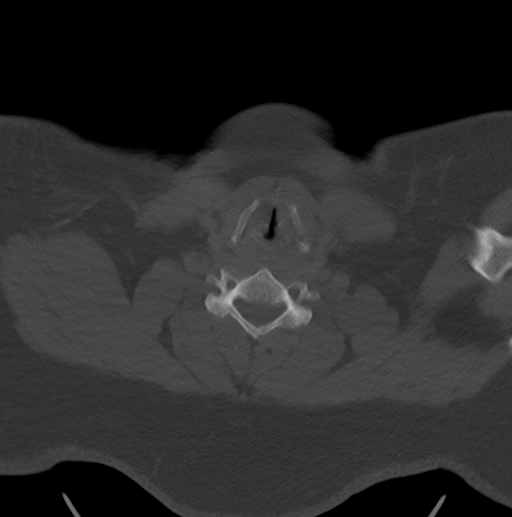

[15 of 33 positions shown; findings below may reference images not displayed]

FINDINGS: Pharynx and larynx: Bilateral symmetric tonsillar prominence which
could go along with tonsillitis. No evidence of tonsillar or
peritonsillar abscess. There is slight thickening of the pharyngeal
soft tissues as well consistent with pharyngitis. There may be a
small retropharyngeal effusion. No focal collection to suggest
retropharyngeal abscess. No sign of mucosal or submucosal mass.

Salivary glands: Parotid and submandibular glands are symmetric and
within normal limits.

Thyroid: Normal

Lymph nodes: No enlarged or low-density nodes on either side of the
neck. Symmetric prominent level 2 nodes are upper limits of normal.

Vascular: No abnormal vascular finding.

Limited intracranial: Normal

Visualized orbits: Not included

Mastoids and visualized paranasal sinuses: Inflammatory changes of
the paranasal sinuses, particularly the maxillary sinuses.

Skeleton: No bone abnormality seen.

Upper chest: Negative

Other: None
IMPRESSION: Bilateral symmetric tonsillar prominence suggesting tonsillitis. No
evidence of tonsillar or peritonsillar abscess. Slight thickening of
the pharyngeal soft tissues as well consistent with pharyngitis.
There may be a small retropharyngeal effusion. No sign of mass
lesion.

Slightly prominent but upper limits of normal level 2 nodes, which
could be reactive to the inflammatory changes. No suppuration.

## 2019-10-29 IMAGING — DX DG CHEST 2V
2 series · 2 of 2 positions shown · non-contrast
Comparison: 12/13/2003.

CLINICAL DATA: Shortness of breath and chest pain

EXAM:
CHEST  2 VIEW

[chest pa]
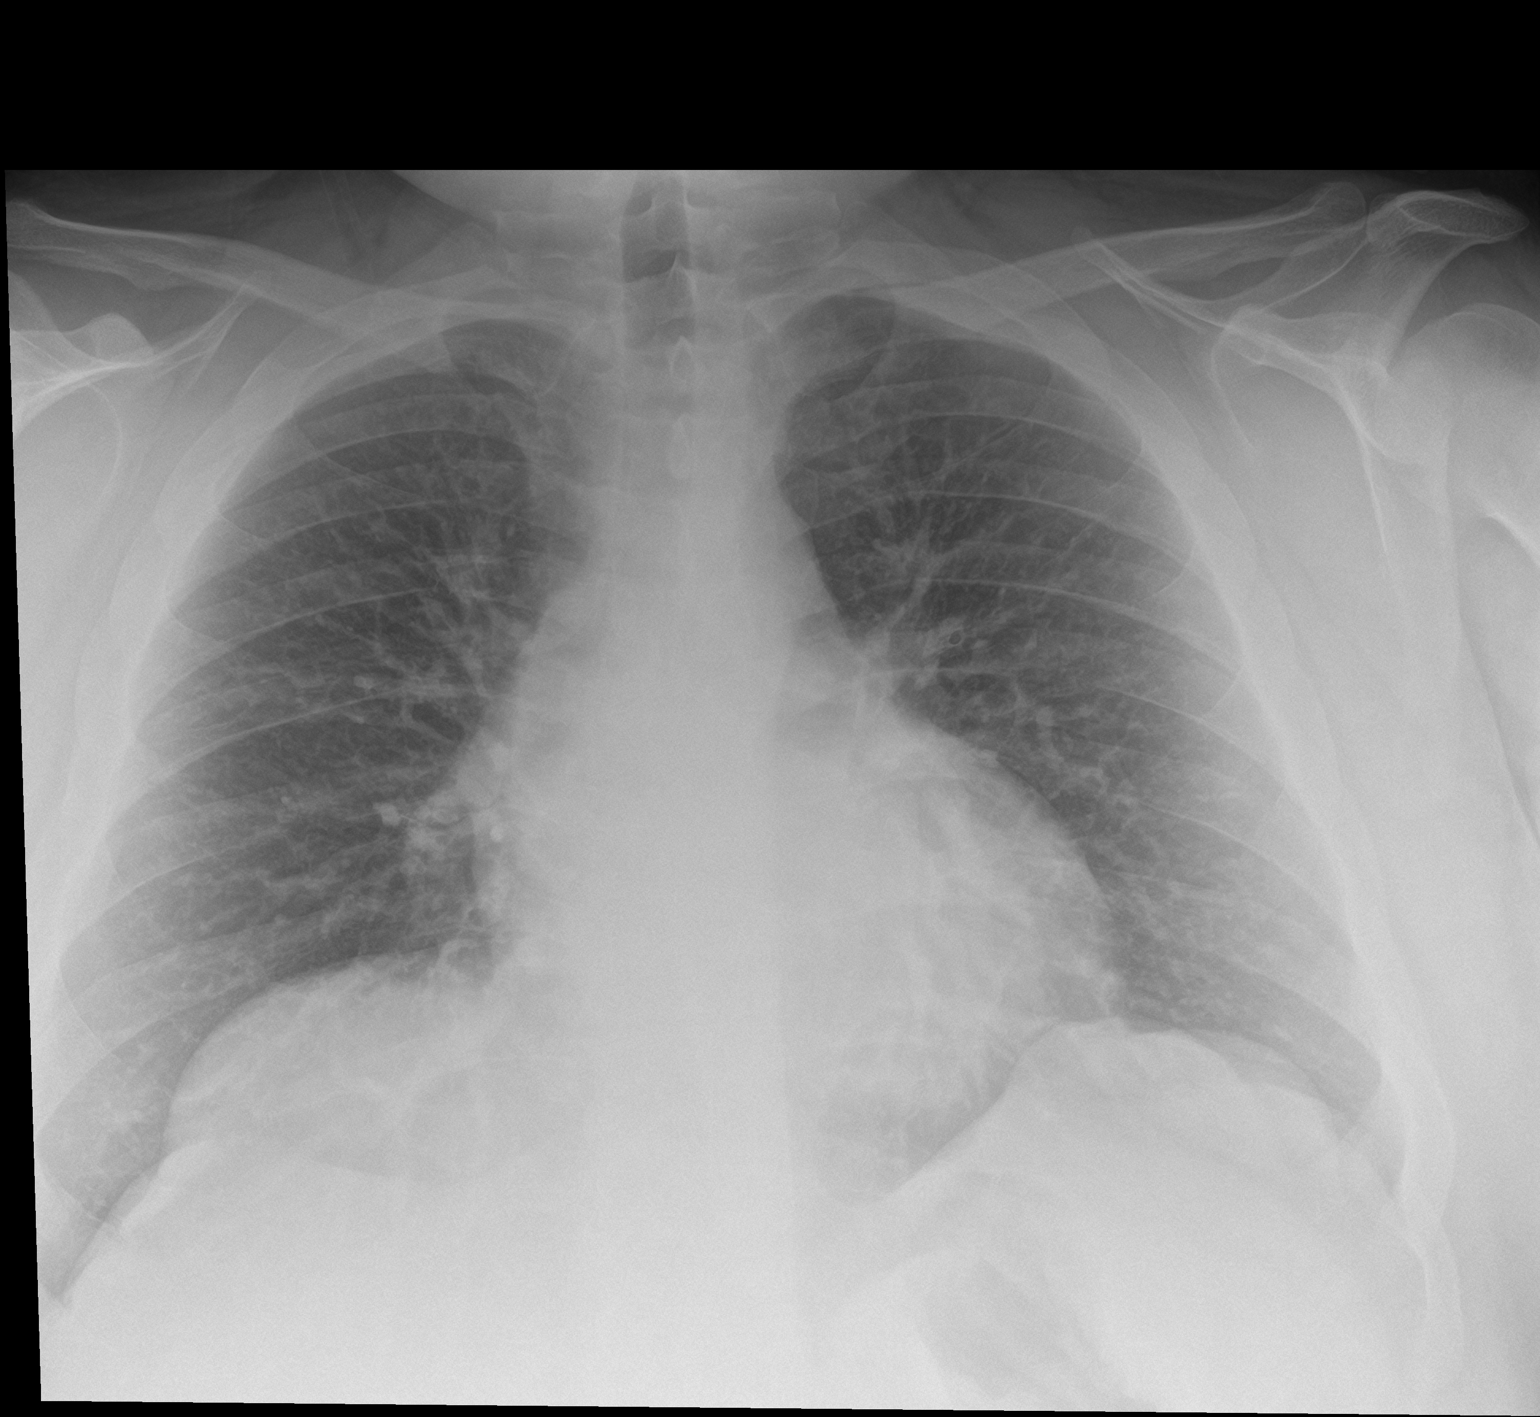

[chest lat]
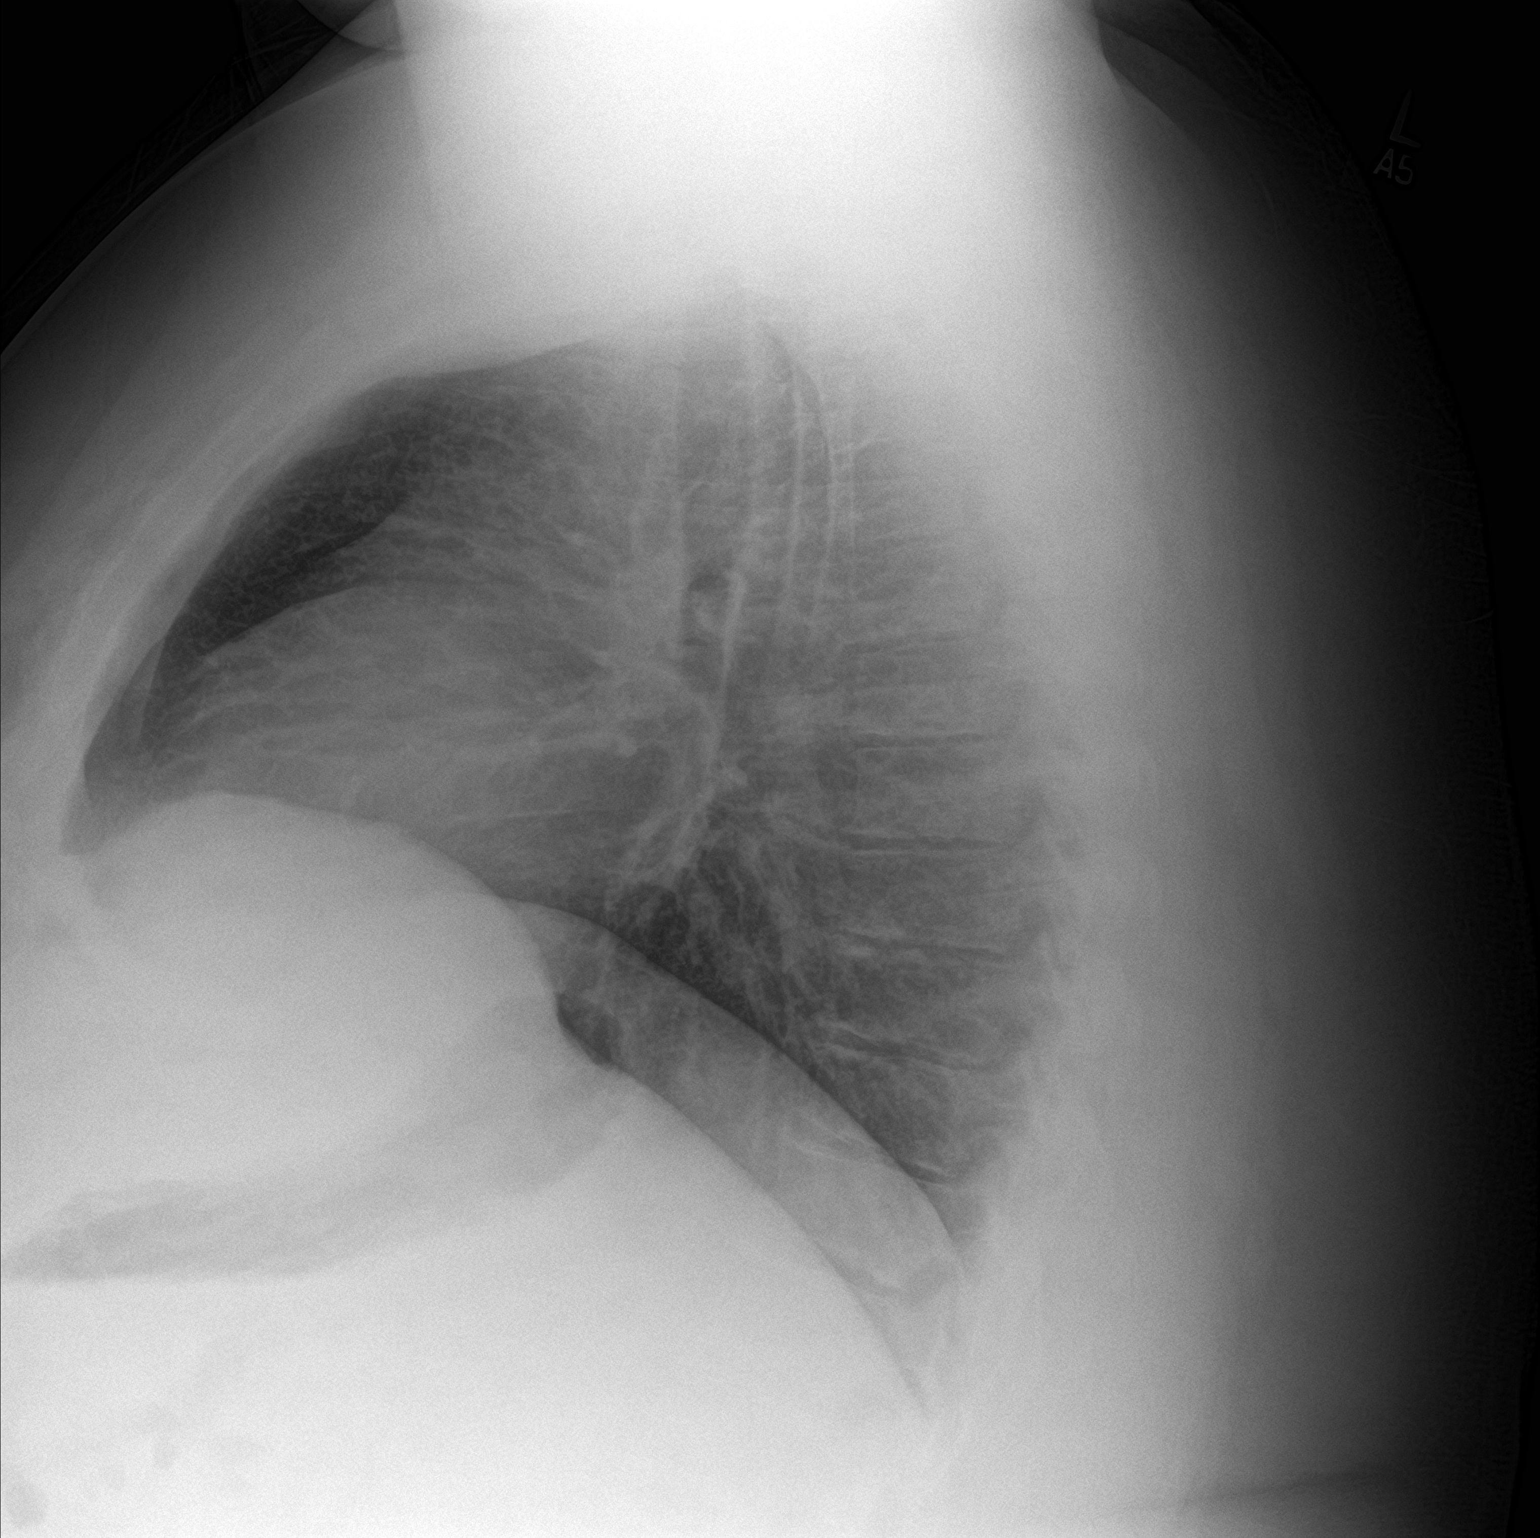

[2 of 2 positions shown; findings below may reference images not displayed]

FINDINGS: Cardiopericardial silhouette is at upper limits of normal for size.
The lungs are clear without focal pneumonia, edema, pneumothorax or
pleural effusion. The visualized bony structures of the thorax are
intact.
IMPRESSION: No active cardiopulmonary disease.

## 2022-01-12 ENCOUNTER — Encounter: Payer: Self-pay | Admitting: Internal Medicine

## 2022-02-07 NOTE — H&P (View-Only) (Signed)
GI Office Note    Referring Provider: [place] Nation, MD Primary Care Physician:  [place] Nation, MD  Primary Gastroenterologist: Dr. Abbey Chatters  Chief Complaint   Chief Complaint  Patient presents with   Rectal Bleeding    Abdominal pains, IBS, watery diarrhea at least 10 times a day.     History of Present Illness   Shawn Griffith is a 31 y.o. male presenting today at the request of [place] Nation, MD for hematochezia and abdominal pain.  Last visit note from [facility] 01/11/2022: Patient reported hematochezia described as chronic and intermittent with a gradual onset.  Also stated he was having abdominal pain located diffusely also described as chronic and intermittent with cramping after eating.  Reported this was going on for greater than a year.  Patient reported he thought he had IBS.  Patient was started on dicyclomine 10 mg twice daily.   Recent labs 02/02/22: Cholesterol 176, HDL 32, triglycerides 539, LDL 97, vitamin D 13.5, HIV nonreactive, TSH 5.355, A1c 9.4, glucose 270, creatinine 0.81, AST 24, ALT 37, alk phos 111, T. bili 0.7; no evidence of CBC or iron panel was performed.  Today: Diarrhea - 10 watery stools a day associated with abdominal pain. Does not matter what he eats, he goes to the bathroom after meals. Does have nocturnal stools. Does intermittently have rectal bleeding that happens once or twice a week or more. Doe shave trouble with spicy or fatty foods as well. Was trying to avoid stuff and symptoms didn't really change it. Fruits and things seem to make it worse. Seafood makes it really bad as well. Denies fevers/chills. Did just find out that he has high blood pressure and diabetes as well. Has follow up with PCP this Wednesday. Gets bloated with eating. Has pain at times with umbilical hernia. Has lost 150 lbs 2 years ago that was intentional. This has been getting worse and has been occurring for at least 13 years or more. Nausea in the morning  that gets better throughout the day - does not eat until dinner time. If he chugs drinks he gets sick. Always thirsty. Has taken pepto before to help with diarrhea and never tried imodium. Pepto helped nausea. Fiber bars seemed to help a little in the past. Does have gas - sometimes it is more pungent.   Bentyl has not made it better - feels like after her takes it he has more burning. Has only been taking it once. Thinks he may have had lyme disease in the past.   Feels like he may have an umbilical hernia. Does not like to see doctors and has anxiety at baseline. Has to be put to sleep for dental procedures.   Has been having high blood pressures at home in the mornings. Not taking anything for diabetes. Is going to talk to the PCP regarding sleep apnea testing this week.   Father had bowel resection from a done and hernia repair due to strangulation.   Had internal bleeding as a kid from eating fish from a contaminated pond. No surgeries.   Just had blood work done last week - Vit D deficiency, high A1c, thyroid issue.   Aunt had a lot of abdominal surgeries. Passed away at 55.     Current Outpatient Medications  Medication Sig Dispense Refill   dicyclomine (BENTYL) 10 MG capsule Take 10 mg by mouth 2 (two) times daily.     losartan (COZAAR) 50 MG tablet Take 50  mg by mouth daily.     acetaminophen (TYLENOL) 500 MG tablet Take 1,500 mg by mouth every 8 (eight) hours as needed for moderate pain. For pain (Patient not taking: Reported on 02/13/2022)     HYDROcodone-acetaminophen (NORCO/VICODIN) 5-325 MG per tablet Take 1 tablet by mouth every 4 (four) hours as needed. (Patient not taking: Reported on 08/10/2017) 15 tablet 0   ibuprofen (ADVIL,MOTRIN) 600 MG tablet Take 1 tablet (600 mg total) by mouth every 6 (six) hours as needed. (Patient not taking: Reported on 02/13/2022) 20 tablet 0   No current facility-administered medications for this visit.    Past Medical History:  Diagnosis Date    Hypertension    Ruptured ear drum    right    No past surgical history on file.  No family history on file.  Allergies as of 02/13/2022   (No Known Allergies)    Social History   Socioeconomic History   Marital status: Married    Spouse name: Not on file   Number of children: Not on file   Years of education: Not on file   Highest education level: Not on file  Occupational History   Not on file  Tobacco Use   Smoking status: Every Day    Packs/day: 0.50    Years: 15.00    Total pack years: 7.50    Types: Cigarettes   Smokeless tobacco: Never  Substance and Sexual Activity   Alcohol use: No   Drug use: Yes    Types: Marijuana   Sexual activity: Not on file  Other Topics Concern   Not on file  Social History Narrative   Not on file   Social Determinants of Health   Financial Resource Strain: Not on file  Food Insecurity: Not on file  Transportation Needs: Not on file  Physical Activity: Not on file  Stress: Not on file  Social Connections: Not on file  Intimate Partner Violence: Not on file     Review of Systems   Gen: Denies any fever, chills, fatigue, weight loss, lack of appetite.  CV: Denies chest pain, heart palpitations, peripheral edema, syncope.  Resp: Denies shortness of breath at rest or with exertion. Denies wheezing or cough.  GI: see HPI GU : Denies urinary burning, urinary frequency, urinary hesitancy MS: Denies joint pain, muscle weakness, cramps, or limitation of movement.  Derm: Denies rash, itching, dry skin Psych: Denies depression, anxiety, memory loss, and confusion Heme: Denies bruising, bleeding, and enlarged lymph nodes.   Physical Exam   BP (!) 155/92 (BP Location: Right Arm, Patient Position: Sitting, Cuff Size: Large)   Pulse 99   Temp 97.8 F (36.6 C) (Temporal)   Ht _0  (1.88 m)   Wt (!) 385 lb 6.4 oz (174.8 kg)   BMI 49.48 kg/m   General:   Alert and oriented. Pleasant and cooperative. Well-nourished and  well-developed.  Head:  Normocephalic and atraumatic. Eyes:  Without icterus, sclera clear and conjunctiva pink.  Ears:  Normal auditory acuity. Mouth:  No deformity or lesions, oral mucosa pink.  Lungs:  Clear to auscultation bilaterally. No wheezes, rales, or rhonchi. No distress.  Heart:  S1, S2 present without murmurs appreciated.  Abdomen:  +BS, soft, non-tender and non-distended. No HSM noted. No guarding or rebound. No masses appreciated.  Rectal:  Deferred  Msk:  Symmetrical without gross deformities. Normal posture. Extremities:  Without edema. Neurologic:  Alert and  oriented x4;  grossly normal neurologically. Skin:  Intact without  significant lesions or rashes. Psych:  Alert and cooperative. Normal mood and affect.   Assessment   Shawn Griffith is a 31 y.o. male with a history of hypertension presenting today with complaint of hematochezia, abdominal pain, and diarrhea.   Diarrhea/nausea/abdominal pain: Patient reports that her 31 year old more history of chronic diarrhea, having about 10 watery stools a day associated with abdominal pain and intermittent nausea.  He also has intermittent rectal bleeding that happens once or twice a week most weeks, sometimes more.  He does have nocturnal stools and occasional mucus contents in his stool.  He does report that stools are also worse after he eats, particularly after eating fruit, seafood, or spicy or fatty foods.  He denies fever, chills, or recent sick contacts.  He does note occasional bloating as well when he eats with protrusion of his umbilical hernia.  Due to his nausea he typically does not eat breakfast or lunch and primarily only eats dinner.  Also reports continuously being thirsty, likely secondary to loose watery stools.  He has only tried Pepto-Bismol in the past and this more so helped with nausea rather than his diarrhea.  Patient and his wife report his flatulence has been much more pungent.  He was recently given  prescription for Bentyl which felt like it actually made his symptoms worse feel like he had burning in his abdomen.  Of note patient also reports history of Lyme disease.  He states his father had bowel resection done in the past but unsure for what, and has had hernia repair.  Also reports an aunt had a lot of abdominal surgeries as well, she passed away at age 7.  He denies any overt family history of colon cancer or colon polyps.  Interestingly patient reported he had internal bleeding as a kid from eating a contaminated fish from a pond.  He states what ever happened during that time was self-limiting, did not require any surgeries.  Last lab work noted elevated TSH, patient may likely have hypothyroidism which would cause constipation rather than diarrhea.    Given his symptoms I have strong suspicion of inflammatory bowel disease, however currently unable to rule out dietary issues, infection, alpha gal, small intestinal bacterial overgrowth, or malignancy (although less likely).  For now have advised stool studies, celiac labs, CRP, CBC and iron panel as these were not recently completed, and alpha gal testing.  To rule out IBD and/or microscopic colitis as cause of symptoms, we will proceed with colonoscopy with propofol with Dr. Abbey Chatters in near future.   Umbilical hernia: Patient reports recent progression of protrusion of his umbilical hernia, it is reducible and soft.  He denies any overt abdominal pain with his hernia.  Advised patient to continue to monitor for for signs of strangulation, patient is in agreement.  PLAN   Stop bentyl Stool studies  Celiac labs CRP Alpha gal testing Imodium as needed Proceed with colonoscopy with propofol by Dr. Abbey Chatters in near future with small bowel and colon biopsies: the risks, benefits, and alternatives have been discussed with the patient in detail. The patient states understanding and desires to proceed. ASA 3 Call to provide with updated medication  list prior to colonoscopy so medication adjustments can be made. Follow up 6 weeks post procedure    Venetia Night, MSN, FNP-BC, AGACNP-BC [facility]

## 2022-02-07 NOTE — Progress Notes (Unsigned)
GI Office Note    Referring Provider: [place] Nation, MD Primary Care Physician:  [place] Nation, MD  Primary Gastroenterologist: Dr. Abbey Chatters  Chief Complaint   Chief Complaint  Patient presents with   Rectal Bleeding    Abdominal pains, IBS, watery diarrhea at least 10 times a day.     History of Present Illness   Shawn Griffith is a 31 y.o. male presenting today at the request of [place] Nation, MD for hematochezia and abdominal pain.  Last visit note from [facility] 01/11/2022: Patient reported hematochezia described as chronic and intermittent with a gradual onset.  Also stated he was having abdominal pain located diffusely also described as chronic and intermittent with cramping after eating.  Reported this was going on for greater than a year.  Patient reported he thought he had IBS.  Patient was started on dicyclomine 10 mg twice daily.   Recent labs 02/02/22: Cholesterol 176, HDL 32, triglycerides 539, LDL 97, vitamin D 13.5, HIV nonreactive, TSH 5.355, A1c 9.4, glucose 270, creatinine 0.81, AST 24, ALT 37, alk phos 111, T. bili 0.7; no evidence of CBC or iron panel was performed.  Today: Diarrhea - 10 watery stools a day associated with abdominal pain. Does not matter what he eats, he goes to the bathroom after meals. Does have nocturnal stools. Does intermittently have rectal bleeding that happens once or twice a week or more. Doe shave trouble with spicy or fatty foods as well. Was trying to avoid stuff and symptoms didn't really change it. Fruits and things seem to make it worse. Seafood makes it really bad as well. Denies fevers/chills. Did just find out that he has high blood pressure and diabetes as well. Has follow up with PCP this Wednesday. Gets bloated with eating. Has pain at times with umbilical hernia. Has lost 150 lbs 2 years ago that was intentional. This has been getting worse and has been occurring for at least 13 years or more. Nausea in the morning  that gets better throughout the day - does not eat until dinner time. If he chugs drinks he gets sick. Always thirsty. Has taken pepto before to help with diarrhea and never tried imodium. Pepto helped nausea. Fiber bars seemed to help a little in the past. Does have gas - sometimes it is more pungent.   Bentyl has not made it better - feels like after her takes it he has more burning. Has only been taking it once. Thinks he may have had lyme disease in the past.   Feels like he may have an umbilical hernia. Does not like to see doctors and has anxiety at baseline. Has to be put to sleep for dental procedures.   Has been having high blood pressures at home in the mornings. Not taking anything for diabetes. Is going to talk to the PCP regarding sleep apnea testing this week.   Father had bowel resection from a done and hernia repair due to strangulation.   Had internal bleeding as a kid from eating fish from a contaminated pond. No surgeries.   Just had blood work done last week - Vit D deficiency, high A1c, thyroid issue.   Aunt had a lot of abdominal surgeries. Passed away at 55.     Current Outpatient Medications  Medication Sig Dispense Refill   dicyclomine (BENTYL) 10 MG capsule Take 10 mg by mouth 2 (two) times daily.     losartan (COZAAR) 50 MG tablet Take 50  mg by mouth daily.     acetaminophen (TYLENOL) 500 MG tablet Take 1,500 mg by mouth every 8 (eight) hours as needed for moderate pain. For pain (Patient not taking: Reported on 02/13/2022)     HYDROcodone-acetaminophen (NORCO/VICODIN) 5-325 MG per tablet Take 1 tablet by mouth every 4 (four) hours as needed. (Patient not taking: Reported on 08/10/2017) 15 tablet 0   ibuprofen (ADVIL,MOTRIN) 600 MG tablet Take 1 tablet (600 mg total) by mouth every 6 (six) hours as needed. (Patient not taking: Reported on 02/13/2022) 20 tablet 0   No current facility-administered medications for this visit.    Past Medical History:  Diagnosis Date    Hypertension    Ruptured ear drum    right    No past surgical history on file.  No family history on file.  Allergies as of 02/13/2022   (No Known Allergies)    Social History   Socioeconomic History   Marital status: Married    Spouse name: Not on file   Number of children: Not on file   Years of education: Not on file   Highest education level: Not on file  Occupational History   Not on file  Tobacco Use   Smoking status: Every Day    Packs/day: 0.50    Years: 15.00    Total pack years: 7.50    Types: Cigarettes   Smokeless tobacco: Never  Substance and Sexual Activity   Alcohol use: No   Drug use: Yes    Types: Marijuana   Sexual activity: Not on file  Other Topics Concern   Not on file  Social History Narrative   Not on file   Social Determinants of Health   Financial Resource Strain: Not on file  Food Insecurity: Not on file  Transportation Needs: Not on file  Physical Activity: Not on file  Stress: Not on file  Social Connections: Not on file  Intimate Partner Violence: Not on file     Review of Systems   Gen: Denies any fever, chills, fatigue, weight loss, lack of appetite.  CV: Denies chest pain, heart palpitations, peripheral edema, syncope.  Resp: Denies shortness of breath at rest or with exertion. Denies wheezing or cough.  GI: see HPI GU : Denies urinary burning, urinary frequency, urinary hesitancy MS: Denies joint pain, muscle weakness, cramps, or limitation of movement.  Derm: Denies rash, itching, dry skin Psych: Denies depression, anxiety, memory loss, and confusion Heme: Denies bruising, bleeding, and enlarged lymph nodes.   Physical Exam   BP (!) 155/92 (BP Location: Right Arm, Patient Position: Sitting, Cuff Size: Large)   Pulse 99   Temp 97.8 F (36.6 C) (Temporal)   Ht _0  (1.88 m)   Wt (!) 385 lb 6.4 oz (174.8 kg)   BMI 49.48 kg/m   General:   Alert and oriented. Pleasant and cooperative. Well-nourished and  well-developed.  Head:  Normocephalic and atraumatic. Eyes:  Without icterus, sclera clear and conjunctiva pink.  Ears:  Normal auditory acuity. Mouth:  No deformity or lesions, oral mucosa pink.  Lungs:  Clear to auscultation bilaterally. No wheezes, rales, or rhonchi. No distress.  Heart:  S1, S2 present without murmurs appreciated.  Abdomen:  +BS, soft, non-tender and non-distended. No HSM noted. No guarding or rebound. No masses appreciated.  Rectal:  Deferred  Msk:  Symmetrical without gross deformities. Normal posture. Extremities:  Without edema. Neurologic:  Alert and  oriented x4;  grossly normal neurologically. Skin:  Intact without  significant lesions or rashes. Psych:  Alert and cooperative. Normal mood and affect.   Assessment   Shawn Griffith is a 31 y.o. male with a history of hypertension presenting today with complaint of hematochezia, abdominal pain, and diarrhea.   Diarrhea/nausea/abdominal pain: Patient reports that her 31 year old more history of chronic diarrhea, having about 10 watery stools a day associated with abdominal pain and intermittent nausea.  He also has intermittent rectal bleeding that happens once or twice a week most weeks, sometimes more.  He does have nocturnal stools and occasional mucus contents in his stool.  He does report that stools are also worse after he eats, particularly after eating fruit, seafood, or spicy or fatty foods.  He denies fever, chills, or recent sick contacts.  He does note occasional bloating as well when he eats with protrusion of his umbilical hernia.  Due to his nausea he typically does not eat breakfast or lunch and primarily only eats dinner.  Also reports continuously being thirsty, likely secondary to loose watery stools.  He has only tried Pepto-Bismol in the past and this more so helped with nausea rather than his diarrhea.  Patient and his wife report his flatulence has been much more pungent.  He was recently given  prescription for Bentyl which felt like it actually made his symptoms worse feel like he had burning in his abdomen.  Of note patient also reports history of Lyme disease.  He states his father had bowel resection done in the past but unsure for what, and has had hernia repair.  Also reports an aunt had a lot of abdominal surgeries as well, she passed away at age 7.  He denies any overt family history of colon cancer or colon polyps.  Interestingly patient reported he had internal bleeding as a kid from eating a contaminated fish from a pond.  He states what ever happened during that time was self-limiting, did not require any surgeries.  Last lab work noted elevated TSH, patient may likely have hypothyroidism which would cause constipation rather than diarrhea.    Given his symptoms I have strong suspicion of inflammatory bowel disease, however currently unable to rule out dietary issues, infection, alpha gal, small intestinal bacterial overgrowth, or malignancy (although less likely).  For now have advised stool studies, celiac labs, CRP, CBC and iron panel as these were not recently completed, and alpha gal testing.  To rule out IBD and/or microscopic colitis as cause of symptoms, we will proceed with colonoscopy with propofol with Dr. Abbey Chatters in near future.   Umbilical hernia: Patient reports recent progression of protrusion of his umbilical hernia, it is reducible and soft.  He denies any overt abdominal pain with his hernia.  Advised patient to continue to monitor for for signs of strangulation, patient is in agreement.  PLAN   Stop bentyl Stool studies  Celiac labs CRP Alpha gal testing Imodium as needed Proceed with colonoscopy with propofol by Dr. Abbey Chatters in near future with small bowel and colon biopsies: the risks, benefits, and alternatives have been discussed with the patient in detail. The patient states understanding and desires to proceed. ASA 3 Call to provide with updated medication  list prior to colonoscopy so medication adjustments can be made. Follow up 6 weeks post procedure    Venetia Night, MSN, FNP-BC, AGACNP-BC [facility]

## 2022-02-13 ENCOUNTER — Encounter: Payer: Self-pay | Admitting: Gastroenterology

## 2022-02-13 ENCOUNTER — Telehealth: Payer: Self-pay | Admitting: *Deleted

## 2022-02-13 ENCOUNTER — Ambulatory Visit (INDEPENDENT_AMBULATORY_CARE_PROVIDER_SITE_OTHER): Payer: Medicaid Other | Admitting: Gastroenterology

## 2022-02-13 ENCOUNTER — Encounter: Payer: Self-pay | Admitting: *Deleted

## 2022-02-13 ENCOUNTER — Telehealth: Payer: Self-pay | Admitting: Gastroenterology

## 2022-02-13 VITALS — BP 155/92 | HR 99 | Temp 97.8°F | Ht 74.0 in | Wt 385.4 lb

## 2022-02-13 DIAGNOSIS — R197 Diarrhea, unspecified: Secondary | ICD-10-CM

## 2022-02-13 DIAGNOSIS — R1084 Generalized abdominal pain: Secondary | ICD-10-CM

## 2022-02-13 DIAGNOSIS — K625 Hemorrhage of anus and rectum: Secondary | ICD-10-CM

## 2022-02-13 DIAGNOSIS — Z8619 Personal history of other infectious and parasitic diseases: Secondary | ICD-10-CM | POA: Diagnosis not present

## 2022-02-13 DIAGNOSIS — R140 Abdominal distension (gaseous): Secondary | ICD-10-CM | POA: Diagnosis not present

## 2022-02-13 DIAGNOSIS — R110 Nausea: Secondary | ICD-10-CM

## 2022-02-13 DIAGNOSIS — K429 Umbilical hernia without obstruction or gangrene: Secondary | ICD-10-CM

## 2022-02-13 MED ORDER — CLENPIQ 10-3.5-12 MG-GM -GM/175ML PO SOLN: 1.0000 | ORAL | 0 refills | Status: DC

## 2022-02-13 NOTE — Patient Instructions (Addendum)
Would you stop taking Bentyl since has not been very helpful for your symptoms.  I am ordering stool studies for you completed at [facility].  I will check some inflammatory markers, celiac labs, and alpha gal due to your history of tick bites and Lyme disease.  We are also scheduling you for colonoscopy with propofol with Dr. Marletta Lor in the near future.  To reiterate the risk of the procedure includes a 0.1% chance of reaction to medication, bleeding, infection, perforation.  There will be written consent for you to sign the day of the procedure.   Keep your follow-up with your PCP, please call the office and let me know what diabetes medications you are started on and when or how you are spacer take them so I can give you proper preop instructions.  If you are not able to maintain a blood pressure with a systolic less than 160 on your current medication please call the office to reschedule your procedure.  It was a pleasure to see you today. I want to create trusting relationships with patients. If you receive a survey regarding your visit,  I greatly appreciate you taking time to fill this out on paper or through your MyChart. I value your feedback.  Brooke Bonito, MSN, FNP-BC, AGACNP-BC [facility]

## 2022-02-13 NOTE — Telephone Encounter (Signed)
PA approved via [facility] website. Auth# [id number], DOS: Mar 01, 2022 - May 30, 2022

## 2022-02-13 NOTE — Telephone Encounter (Signed)
Please send request for patient's most recent labs from PCP office.

## 2022-02-14 ENCOUNTER — Encounter: Payer: Self-pay | Admitting: *Deleted

## 2022-02-14 NOTE — Telephone Encounter (Signed)
Labs are on desk and under the [facility] tab in patient's chart for review.

## 2022-02-27 NOTE — Patient Instructions (Signed)
Shawn Griffith Head  02/27/2022     @PREFPERIOPPHARMACY @   Your procedure is scheduled on  03/06/2022.   Report to 03/08/2022 at  0800  A.M.   Call this number if you have problems the morning of surgery:  [phone]   Remember:  Follow the diet and prep instructions given to you by the office.    DO NOT take any medications for diabetes the morning of your procedure.     Take these medicines the morning of surgery with A SIP OF WATER                                      synthroid.     Do not wear jewelry, make-up or nail polish.  Do not wear lotions, powders, or perfumes, or deodorant.  Do not shave 48 hours prior to surgery.  Men may shave face and neck.  Do not bring valuables to the hospital.  [facility] is not responsible for any belongings or valuables.  Contacts, dentures or bridgework may not be worn into surgery.  Leave your suitcase in the car.  After surgery it may be brought to your room.  For patients admitted to the hospital, discharge time will be determined by your treatment team.  Patients discharged the day of surgery will not be allowed to drive home and must have someone with them for 24 hours.    Special instructions:   DO NOT smoke tobacco or vape for 24 hours before your procedure.  Please read over the following fact sheets that you were given. Anesthesia Post-op Instructions and Care and Recovery After Surgery      Colonoscopy, Adult, Care After The following information offers guidance on how to care for yourself after your procedure. Your health care provider may also give you more specific instructions. If you have problems or questions, contact your health care provider. What can I expect after the procedure? After the procedure, it is common to have: A small amount of blood in your stool for 24 hours after the procedure. Some gas. Mild cramping or bloating of your abdomen. Follow these instructions at home: Eating and  drinking  Drink enough fluid to keep your urine pale yellow. Follow instructions from your health care provider about eating or drinking restrictions. Resume your normal diet as told by your health care provider. Avoid heavy or fried foods that are hard to digest. Activity Rest as told by your health care provider. Avoid sitting for a long time without moving. Get up to take short walks every 1-2 hours. This is important to improve blood flow and breathing. Ask for help if you feel weak or unsteady. Return to your normal activities as told by your health care provider. Ask your health care provider what activities are safe for you. Managing cramping and bloating  Try walking around when you have cramps or feel bloated. If directed, apply heat to your abdomen as told by your health care provider. Use the heat source that your health care provider recommends, such as a moist heat pack or a heating pad. Place a towel between your skin and the heat source. Leave the heat on for 20-30 minutes. Remove the heat if your skin turns bright red. This is especially important if you are unable to feel pain, heat, or cold. You have a greater risk of getting burned. General  instructions If you were given a sedative during the procedure, it can affect you for several hours. Do not drive or operate machinery until your health care provider says that it is safe. For the first 24 hours after the procedure: Do not sign important documents. Do not drink alcohol. Do your regular daily activities at a slower pace than normal. Eat soft foods that are easy to digest. Take over-the-counter and prescription medicines only as told by your health care provider. Keep all follow-up visits. This is important. Contact a health care provider if: You have blood in your stool 2-3 days after the procedure. Get help right away if: You have more than a small spotting of blood in your stool. You have large blood clots in your  stool. You have swelling of your abdomen. You have nausea or vomiting. You have a fever. You have increasing pain in your abdomen that is not relieved with medicine. These symptoms may be an emergency. Get help right away. Call 911. Do not wait to see if the symptoms will go away. Do not drive yourself to the hospital. Summary After the procedure, it is common to have a small amount of blood in your stool. You may also have mild cramping and bloating of your abdomen. If you were given a sedative during the procedure, it can affect you for several hours. Do not drive or operate machinery until your health care provider says that it is safe. Get help right away if you have a lot of blood in your stool, nausea or vomiting, a fever, or increased pain in your abdomen. This information is not intended to replace advice given to you by your health care provider. Make sure you discuss any questions you have with your health care provider. Document Revised: 02/16/2021 Document Reviewed: 02/16/2021 Elsevier Patient Education  [place] After This sheet gives you information about how to care for yourself after your procedure. Your health care provider may also give you more specific instructions. If you have problems or questions, contact your health care provider. What can I expect after the procedure? After the procedure, it is common to have: Tiredness. Forgetfulness about what happened after the procedure. Impaired judgment for important decisions. Nausea or vomiting. Some difficulty with balance. Follow these instructions at home: For the time period you were told by your health care provider:     Rest as needed. Do not participate in activities where you could fall or become injured. Do not drive or use machinery. Do not drink alcohol. Do not take sleeping pills or medicines that cause drowsiness. Do not make important decisions or sign legal  documents. Do not take care of children on your own. Eating and drinking Follow the diet that is recommended by your health care provider. Drink enough fluid to keep your urine pale yellow. If you vomit: Drink water, juice, or soup when you can drink without vomiting. Make sure you have little or no nausea before eating solid foods. General instructions Have a responsible adult stay with you for the time you are told. It is important to have someone help care for you until you are awake and alert. Take over-the-counter and prescription medicines only as told by your health care provider. If you have sleep apnea, surgery and certain medicines can increase your risk for breathing problems. Follow instructions from your health care provider about wearing your sleep device: Anytime you are sleeping, including during daytime naps. While taking prescription  pain medicines, sleeping medicines, or medicines that make you drowsy. Avoid smoking. Keep all follow-up visits as told by your health care provider. This is important. Contact a health care provider if: You keep feeling nauseous or you keep vomiting. You feel light-headed. You are still sleepy or having trouble with balance after 24 hours. You develop a rash. You have a fever. You have redness or swelling around the IV site. Get help right away if: You have trouble breathing. You have new-onset confusion at home. Summary For several hours after your procedure, you may feel tired. You may also be forgetful and have poor judgment. Have a responsible adult stay with you for the time you are told. It is important to have someone help care for you until you are awake and alert. Rest as told. Do not drive or operate machinery. Do not drink alcohol or take sleeping pills. Get help right away if you have trouble breathing, or if you suddenly become confused. This information is not intended to replace advice given to you by your health care  provider. Make sure you discuss any questions you have with your health care provider. Document Revised: 05/31/2021 Document Reviewed: 05/29/2019 Elsevier Patient Education  [place].

## 2022-03-01 ENCOUNTER — Encounter (HOSPITAL_COMMUNITY)
Admission: RE | Admit: 2022-03-01 | Discharge: 2022-03-01 | Disposition: A | Discharge status: Home / Self Care | Payer: Medicaid Other | Source: Ambulatory Visit | Attending: Internal Medicine | Admitting: Internal Medicine

## 2022-03-01 ENCOUNTER — Encounter (HOSPITAL_COMMUNITY): Payer: Self-pay

## 2022-03-01 VITALS — BP 143/79 | HR 86 | Temp 97.8°F | Resp 18 | Ht 74.0 in | Wt 385.4 lb

## 2022-03-01 DIAGNOSIS — Z01818 Encounter for other preprocedural examination: Secondary | ICD-10-CM | POA: Diagnosis present

## 2022-03-01 DIAGNOSIS — I10 Essential (primary) hypertension: Secondary | ICD-10-CM | POA: Diagnosis not present

## 2022-03-01 DIAGNOSIS — E119 Type 2 diabetes mellitus without complications: Secondary | ICD-10-CM | POA: Insufficient documentation

## 2022-03-01 HISTORY — DX: Other complications of anesthesia, initial encounter: T88.59XA

## 2022-03-01 HISTORY — DX: Anxiety disorder, unspecified: F41.9

## 2022-03-01 HISTORY — DX: Hypothyroidism, unspecified: E03.9

## 2022-03-01 LAB — BASIC METABOLIC PANEL
Anion gap: 6 (ref 5–15)
BUN: 11 mg/dL (ref 6–20)
CO2: 25 mmol/L (ref 22–32)
Calcium: 8.8 mg/dL — ABNORMAL LOW (ref 8.9–10.3)
Chloride: 106 mmol/L (ref 98–111)
Creatinine, Ser: 0.6 mg/dL — ABNORMAL LOW (ref 0.61–1.24)
GFR, Estimated: >60 mL/min (ref >60)
Glucose, Bld: 192 mg/dL — ABNORMAL HIGH (ref 70–99)
Potassium: 3.9 mmol/L (ref 3.5–5.1)
Sodium: 137 mmol/L (ref 135–145)

## 2022-03-03 LAB — C. DIFFICILE GDH AND TOXIN A/B: GDH ANTIGEN: NOT DETECTED

## 2022-03-04 LAB — C. DIFFICILE GDH AND TOXIN A/B
MICRO NUMBER:: 13828523
SPECIMEN QUALITY:: ADEQUATE
TOXIN A AND B: NOT DETECTED

## 2022-03-04 LAB — GASTROINTESTINAL PATHOGEN PNL
CampyloBacter Group: NOT DETECTED
Norovirus GI/GII: NOT DETECTED
Rotavirus A: NOT DETECTED
Salmonella species: NOT DETECTED
Shiga Toxin 1: NOT DETECTED
Shiga Toxin 2: NOT DETECTED
Shigella Species: NOT DETECTED
Vibrio Group: NOT DETECTED
Yersinia enterocolitica: NOT DETECTED

## 2022-03-06 ENCOUNTER — Encounter (HOSPITAL_COMMUNITY): Payer: Self-pay

## 2022-03-06 ENCOUNTER — Other Ambulatory Visit: Payer: Self-pay

## 2022-03-06 ENCOUNTER — Ambulatory Visit (HOSPITAL_BASED_OUTPATIENT_CLINIC_OR_DEPARTMENT_OTHER): Payer: Medicaid Other | Admitting: Anesthesiology

## 2022-03-06 ENCOUNTER — Encounter (HOSPITAL_COMMUNITY)
Admission: RE | Disposition: A | Discharge status: Home / Self Care | Payer: Self-pay | Source: Home / Self Care | Attending: Internal Medicine

## 2022-03-06 ENCOUNTER — Ambulatory Visit (HOSPITAL_COMMUNITY)
Admission: RE | Admit: 2022-03-06 | Discharge: 2022-03-06 | Disposition: A | Discharge status: Home / Self Care | Payer: Medicaid Other | Attending: Internal Medicine | Admitting: Internal Medicine

## 2022-03-06 ENCOUNTER — Ambulatory Visit (HOSPITAL_COMMUNITY): Payer: Medicaid Other | Admitting: Anesthesiology

## 2022-03-06 DIAGNOSIS — K625 Hemorrhage of anus and rectum: Secondary | ICD-10-CM | POA: Insufficient documentation

## 2022-03-06 DIAGNOSIS — E039 Hypothyroidism, unspecified: Secondary | ICD-10-CM | POA: Diagnosis not present

## 2022-03-06 DIAGNOSIS — F17200 Nicotine dependence, unspecified, uncomplicated: Secondary | ICD-10-CM | POA: Insufficient documentation

## 2022-03-06 DIAGNOSIS — E119 Type 2 diabetes mellitus without complications: Secondary | ICD-10-CM | POA: Diagnosis not present

## 2022-03-06 DIAGNOSIS — R140 Abdominal distension (gaseous): Secondary | ICD-10-CM

## 2022-03-06 DIAGNOSIS — K529 Noninfective gastroenteritis and colitis, unspecified: Secondary | ICD-10-CM | POA: Insufficient documentation

## 2022-03-06 DIAGNOSIS — K648 Other hemorrhoids: Secondary | ICD-10-CM | POA: Diagnosis not present

## 2022-03-06 DIAGNOSIS — R197 Diarrhea, unspecified: Secondary | ICD-10-CM | POA: Diagnosis not present

## 2022-03-06 DIAGNOSIS — Z7984 Long term (current) use of oral hypoglycemic drugs: Secondary | ICD-10-CM

## 2022-03-06 DIAGNOSIS — Z6841 Body Mass Index (BMI) 40.0 and over, adult: Secondary | ICD-10-CM | POA: Diagnosis not present

## 2022-03-06 DIAGNOSIS — I10 Essential (primary) hypertension: Secondary | ICD-10-CM | POA: Diagnosis not present

## 2022-03-06 DIAGNOSIS — F419 Anxiety disorder, unspecified: Secondary | ICD-10-CM | POA: Diagnosis not present

## 2022-03-06 DIAGNOSIS — R109 Unspecified abdominal pain: Secondary | ICD-10-CM

## 2022-03-06 DIAGNOSIS — F17210 Nicotine dependence, cigarettes, uncomplicated: Secondary | ICD-10-CM

## 2022-03-06 DIAGNOSIS — R1030 Lower abdominal pain, unspecified: Secondary | ICD-10-CM | POA: Diagnosis not present

## 2022-03-06 HISTORY — PX: BIOPSY: SHX5522

## 2022-03-06 HISTORY — PX: COLONOSCOPY WITH PROPOFOL: SHX5780

## 2022-03-06 SURGERY — COLONOSCOPY WITH PROPOFOL
Anesthesia: General

## 2022-03-06 MED ORDER — MIDAZOLAM HCL 2 MG/2ML IJ SOLN
INTRAMUSCULAR | Status: AC
  Filled 2022-03-06: qty 2

## 2022-03-06 MED ORDER — LACTATED RINGERS IV SOLN: INTRAVENOUS | Status: DC | PRN

## 2022-03-06 MED ORDER — PROPOFOL 10 MG/ML IV BOLUS
INTRAVENOUS | Status: DC | PRN
  Administered 2022-03-06: 100 mg via INTRAVENOUS
  Administered 2022-03-06: 40 mg via INTRAVENOUS

## 2022-03-06 MED ORDER — PROPOFOL 500 MG/50ML IV EMUL
INTRAVENOUS | Status: DC | PRN
  Administered 2022-03-06: 150 ug/kg/min via INTRAVENOUS

## 2022-03-06 MED ORDER — MIDAZOLAM HCL 2 MG/2ML IJ SOLN
INTRAMUSCULAR | Status: DC | PRN
  Administered 2022-03-06: 2 mg via INTRAVENOUS

## 2022-03-06 NOTE — Transfer of Care (Signed)
Immediate Anesthesia Transfer of Care Note  Patient: Shawn Griffith  Procedure(s) Performed: COLONOSCOPY WITH PROPOFOL BIOPSY  Patient Location: Short Stay  Anesthesia Type:General  Level of Consciousness: awake and alert   Airway & Oxygen Therapy: Patient Spontanous Breathing  Post-op Assessment: Report given to RN and Post -op Vital signs reviewed and stable  Post vital signs: Reviewed and stable  Last Vitals:  Vitals Value Taken Time  BP 111/61 03/06/22 0936  Temp 36.4 C 03/06/22 0936  Pulse 88 03/06/22 0936  Resp 24 03/06/22 0936  SpO2 96 % 03/06/22 0936    Last Pain:  Vitals:   03/06/22 0936  TempSrc: Oral  PainSc: 0-No pain         Complications: No notable events documented.

## 2022-03-06 NOTE — Interval H&P Note (Signed)
History and Physical Interval Note:  03/06/2022 9:16 AM  Shawn Griffith  has presented today for surgery, with the diagnosis of diarrhea, rectal bleeding, bloating, abd pain.  The various methods of treatment have been discussed with the patient and family. After consideration of risks, benefits and other options for treatment, the patient has consented to  Procedure(s) with comments: COLONOSCOPY WITH PROPOFOL (N/A) - 10:00am as a surgical intervention.  The patient's history has been reviewed, patient examined, no change in status, stable for surgery.  I have reviewed the patient's chart and labs.  Questions were answered to the patient's satisfaction.     Lanelle Bal

## 2022-03-06 NOTE — Op Note (Signed)
[facility] Patient Name: Shawn Griffith Procedure Date: 03/06/2022 9:11 AM MRN: [id number] Date of Birth: 04-22-1991 Attending MD: Shawn Alas. Abbey Chatters DO CSN: [id number] Age: 31 Admit Type: Outpatient Procedure:                Colonoscopy Indications:              Lower abdominal pain, Chronic diarrhea, Rectal                            bleeding Providers:                Shawn Alas. Abbey Chatters, DO, Shawn Griffith, Shawn Griffith,                            Shawn Griffith Technician, Technician Referring MD:              Medicines:                See the Anesthesia note for documentation of the                            administered medications Complications:            No immediate complications. Estimated Blood Loss:     Estimated blood loss was minimal. Procedure:                Pre-Anesthesia Assessment:                           - The anesthesia plan was to use monitored                            anesthesia care (MAC).                           After obtaining informed consent, the colonoscope                            was passed under direct vision. Throughout the                            procedure, the patient's blood pressure, pulse, and                            oxygen saturations were monitored continuously. The                            [id number]-HQ190L ([id number]) scope was introduced through                            the anus and advanced to the the terminal ileum,                            with identification of the appendiceal orifice and                            IC valve. The colonoscopy was performed without  difficulty. The patient tolerated the procedure                            well. The quality of the bowel preparation was                            evaluated using the BBPS [facility] Bowel Preparation                            Scale) with scores of: Right Colon = 1 (portion of                            mucosa seen, but other areas not well  seen due to                            staining, residual stool and/or opaque liquid),                            Transverse Colon = 1 (portion of mucosa seen, but                            other areas not well seen due to staining, residual                            stool and/or opaque liquid) and Left Colon = 2                            (minor amount of residual staining, small fragments                            of stool and/or opaque liquid, but mucosa seen                            well). The total BBPS score equals 4. The quality                            of the bowel preparation was inadequate. Scope In: 9:24:43 AM Scope Out: 9:31:45 AM Scope Withdrawal Time: 0 hours 5 minutes 6 seconds  Total Procedure Duration: 0 hours 7 minutes 2 seconds  Findings:      The perianal and digital rectal examinations were normal.      Non-bleeding internal hemorrhoids were found during endoscopy.      The terminal ileum appeared normal. Biopsies were taken with a cold       forceps for histology.      A large amount of semi-solid stool was found in the entire colon,       precluding visualization. Lavage of the area was performed using copious       amounts of sterile water, resulting in incomplete clearance with       continued poor visualization.      Biopsies for histology were taken with a cold forceps from the ascending       colon, transverse colon and descending colon for evaluation of  microscopic colitis. Impression:               - Preparation of the colon was inadequate.                           - Non-bleeding internal hemorrhoids.                           - The examined portion of the ileum was normal.                            Biopsied.                           - Stool in the entire examined colon.                           - Biopsies were taken with a cold forceps from the                            ascending colon, transverse colon and descending                             colon for evaluation of microscopic colitis. Moderate Sedation:      Per Anesthesia Care Recommendation:           - Patient has a contact number available for                            emergencies. The signs and symptoms of potential                            delayed complications were discussed with the                            patient. Return to normal activities tomorrow.                            Written discharge instructions were provided to the                            patient.                           - Resume previous diet.                           - Continue present medications.                           - Await pathology results.                           - Repeat colonoscopy in 6 months because the bowel                            preparation was poor.                           -  Return to GI clinic in 3 months. Procedure Code(s):        --- Professional ---                           [phone], Colonoscopy, flexible; with biopsy, single                            or multiple Diagnosis Code(s):        --- Professional ---                           K64.8, Other hemorrhoids                           R10.30, Lower abdominal pain, unspecified                           K52.9, Noninfective gastroenteritis and colitis,                            unspecified                           K62.5, Hemorrhage of anus and rectum CPT copyright 2019 American Medical Association. All rights reserved. The codes documented in this report are preliminary and upon coder review may  be revised to meet current compliance requirements. Shawn Alas. Abbey Chatters, DO Shawn Abbey Chatters, DO 03/06/2022 9:34:52 AM This report has been signed electronically. Number of Addenda: 0

## 2022-03-06 NOTE — Anesthesia Preprocedure Evaluation (Signed)
Anesthesia Evaluation  Patient identified by MRN, date of birth, ID band Patient awake    Reviewed: Allergy & Precautions, H&P , NPO status , Patient's Chart, lab work & pertinent test results, reviewed documented beta blocker date and time   History of Anesthesia Complications (+) Emergence Delirium and history of anesthetic complications  Airway Mallampati: II  TM Distance: >3 FB Neck ROM: full    Dental no notable dental hx.    Pulmonary neg pulmonary ROS, Current Smoker and Patient abstained from smoking.,    Pulmonary exam normal breath sounds clear to auscultation       Cardiovascular Exercise Tolerance: Good hypertension, negative cardio ROS   Rhythm:regular Rate:Normal     Neuro/Psych PSYCHIATRIC DISORDERS Anxiety negative neurological ROS     GI/Hepatic negative GI ROS, Neg liver ROS,   Endo/Other  diabetes, Type 2Hypothyroidism Morbid obesity  Renal/GU negative Renal ROS  negative genitourinary   Musculoskeletal   Abdominal   Peds  Hematology negative hematology ROS (+)   Anesthesia Other Findings   Reproductive/Obstetrics negative OB ROS                             Anesthesia Physical Anesthesia Plan  ASA: 3  Anesthesia Plan: General   Post-op Pain Management:    Induction:   PONV Risk Score and Plan: Propofol infusion  Airway Management Planned:   Additional Equipment:   Intra-op Plan:   Post-operative Plan:   Informed Consent: I have reviewed the patients History and Physical, chart, labs and discussed the procedure including the risks, benefits and alternatives for the proposed anesthesia with the patient or authorized representative who has indicated his/her understanding and acceptance.     Dental Advisory Given  Plan Discussed with: CRNA  Anesthesia Plan Comments:         Anesthesia Quick Evaluation

## 2022-03-06 NOTE — Discharge Instructions (Signed)
  Colonoscopy Discharge Instructions  Read the instructions outlined below and refer to this sheet in the next few weeks. These discharge instructions provide you with general information on caring for yourself after you leave the hospital. Your doctor may also give you specific instructions. While your treatment has been planned according to the most current medical practices available, unavoidable complications occasionally occur.   ACTIVITY You may resume your regular activity, but move at a slower pace for the next 24 hours.  Take frequent rest periods for the next 24 hours.  Walking will help get rid of the air and reduce the bloated feeling in your belly (abdomen).  No driving for 24 hours (because of the medicine (anesthesia) used during the test).   Do not sign any important legal documents or operate any machinery for 24 hours (because of the anesthesia used during the test).  NUTRITION Drink plenty of fluids.  You may resume your normal diet as instructed by your doctor.  Begin with a light meal and progress to your normal diet. Heavy or fried foods are harder to digest and may make you feel sick to your stomach (nauseated).  Avoid alcoholic beverages for 24 hours or as instructed.  MEDICATIONS You may resume your normal medications unless your doctor tells you otherwise.  WHAT YOU CAN EXPECT TODAY Some feelings of bloating in the abdomen.  Passage of more gas than usual.  Spotting of blood in your stool or on the toilet paper.  IF YOU HAD POLYPS REMOVED DURING THE COLONOSCOPY: No aspirin products for 7 days or as instructed.  No alcohol for 7 days or as instructed.  Eat a soft diet for the next 24 hours.  FINDING OUT THE RESULTS OF YOUR TEST Not all test results are available during your visit. If your test results are not back during the visit, make an appointment with your caregiver to find out the results. Do not assume everything is normal if you have not heard from your  caregiver or the medical facility. It is important for you to follow up on all of your test results.  SEEK IMMEDIATE MEDICAL ATTENTION IF: You have more than a spotting of blood in your stool.  Your belly is swollen (abdominal distention).  You are nauseated or vomiting.  You have a temperature over 101.  You have abdominal pain or discomfort that is severe or gets worse throughout the day.   Unfortunately, your colon was not adequately prepped today for colonoscopy.  I did not see any evidence of colon cancer or large polyps, but certainly could have missed smaller polyps due to poor visualization.  Would recommend repeat colonoscopy in 6 months with a different colon prep.  I did take biopsies throughout your whole colon as well as your terminal ileum which is the end portion of your small bowel.  Await pathology results, office will contact you.  Follow-up with GI in 3 months.   I hope you have a great rest of your week!  Hennie Duos. Marletta Lor, D.O. Gastroenterology and Hepatology [facility]

## 2022-03-07 LAB — SURGICAL PATHOLOGY

## 2022-03-07 NOTE — Anesthesia Postprocedure Evaluation (Signed)
Anesthesia Post Note  Patient: Shawn Griffith  Procedure(s) Performed: COLONOSCOPY WITH PROPOFOL BIOPSY  Patient location during evaluation: Phase II Anesthesia Type: General Level of consciousness: awake Pain management: pain level controlled Vital Signs Assessment: post-procedure vital signs reviewed and stable Respiratory status: spontaneous breathing and respiratory function stable Cardiovascular status: blood pressure returned to baseline and stable Postop Assessment: no headache and no apparent nausea or vomiting Anesthetic complications: no Comments: Late entry   No notable events documented.   Last Vitals:  Vitals:   03/06/22 0858 03/06/22 0936  BP: (!) 142/81 111/61  Pulse: 89 88  Resp: 17 (!) 24  Temp: 36.7 C (!) 36.4 C  SpO2: 96% 96%    Last Pain:  Vitals:   03/06/22 0936  TempSrc: Oral  PainSc: 0-No pain                 Windell Norfolk

## 2022-03-10 ENCOUNTER — Encounter (HOSPITAL_COMMUNITY): Payer: Self-pay | Admitting: Internal Medicine

## 2022-03-28 ENCOUNTER — Encounter (HOSPITAL_COMMUNITY): Payer: Self-pay | Admitting: Emergency Medicine

## 2022-03-28 ENCOUNTER — Other Ambulatory Visit: Payer: Self-pay

## 2022-03-28 ENCOUNTER — Emergency Department (HOSPITAL_COMMUNITY)
Admission: EM | Admit: 2022-03-28 | Discharge: 2022-03-28 | Disposition: A | Discharge status: Home / Self Care | Payer: Medicaid Other | Attending: Emergency Medicine | Admitting: Emergency Medicine

## 2022-03-28 ENCOUNTER — Emergency Department (HOSPITAL_COMMUNITY): Payer: Medicaid Other

## 2022-03-28 DIAGNOSIS — Z7984 Long term (current) use of oral hypoglycemic drugs: Secondary | ICD-10-CM | POA: Insufficient documentation

## 2022-03-28 DIAGNOSIS — I10 Essential (primary) hypertension: Secondary | ICD-10-CM | POA: Insufficient documentation

## 2022-03-28 DIAGNOSIS — R1084 Generalized abdominal pain: Secondary | ICD-10-CM | POA: Diagnosis present

## 2022-03-28 DIAGNOSIS — K5900 Constipation, unspecified: Secondary | ICD-10-CM | POA: Insufficient documentation

## 2022-03-28 DIAGNOSIS — E119 Type 2 diabetes mellitus without complications: Secondary | ICD-10-CM | POA: Insufficient documentation

## 2022-03-28 DIAGNOSIS — Z79899 Other long term (current) drug therapy: Secondary | ICD-10-CM | POA: Diagnosis not present

## 2022-03-28 LAB — COMPREHENSIVE METABOLIC PANEL
ALT: 41 U/L (ref 0–44)
AST: 26 U/L (ref 15–41)
Albumin: 3.4 g/dL — ABNORMAL LOW (ref 3.5–5.0)
Alkaline Phosphatase: 83 U/L (ref 38–126)
Anion gap: 9 (ref 5–15)
BUN: 9 mg/dL (ref 6–20)
CO2: 24 mmol/L (ref 22–32)
Calcium: 8.7 mg/dL — ABNORMAL LOW (ref 8.9–10.3)
Chloride: 104 mmol/L (ref 98–111)
Creatinine, Ser: 0.58 mg/dL — ABNORMAL LOW (ref 0.61–1.24)
GFR, Estimated: >60 mL/min (ref >60)
Glucose, Bld: 220 mg/dL — ABNORMAL HIGH (ref 70–99)
Potassium: 3.8 mmol/L (ref 3.5–5.1)
Sodium: 137 mmol/L (ref 135–145)
Total Bilirubin: 0.6 mg/dL (ref 0.3–1.2)
Total Protein: 7.1 g/dL (ref 6.5–8.1)

## 2022-03-28 LAB — CBC WITH DIFFERENTIAL/PLATELET
Abs Immature Granulocytes: 0.05 10*3/uL (ref 0.00–0.07)
Basophils Absolute: 0 10*3/uL (ref 0.0–0.1)
Basophils Relative: 0 %
Eosinophils Absolute: 0.4 10*3/uL (ref 0.0–0.5)
Eosinophils Relative: 4 %
HCT: 43.7 % (ref 39.0–52.0)
Hemoglobin: 14.3 g/dL (ref 13.0–17.0)
Immature Granulocytes: 1 %
Lymphocytes Relative: 29 %
Lymphs Abs: 2.9 10*3/uL (ref 0.7–4.0)
MCH: 28.7 pg (ref 26.0–34.0)
MCHC: 32.7 g/dL (ref 30.0–36.0)
MCV: 87.8 fL (ref 80.0–100.0)
Monocytes Absolute: 0.7 10*3/uL (ref 0.1–1.0)
Monocytes Relative: 7 %
Neutro Abs: 5.8 10*3/uL (ref 1.7–7.7)
Neutrophils Relative %: 59 %
Platelets: 273 10*3/uL (ref 150–400)
RBC: 4.98 MIL/uL (ref 4.22–5.81)
RDW: 12.8 % (ref 11.5–15.5)
WBC: 9.8 10*3/uL (ref 4.0–10.5)
nRBC: 0 % (ref 0.0–0.2)

## 2022-03-28 LAB — LIPASE, BLOOD: Lipase: 24 U/L (ref 11–51)

## 2022-03-28 MED ORDER — HYDROCORTISONE (PERIANAL) 2.5 % EX CREA: 1.0000 | TOPICAL_CREAM | Freq: Two times a day (BID) | CUTANEOUS | 0 refills | Status: AC

## 2022-03-28 MED ORDER — IOHEXOL 300 MG/ML  SOLN
100.0000 mL | Freq: Once | INTRAMUSCULAR | Status: AC | PRN
  Administered 2022-03-28: 100 mL via INTRAVENOUS

## 2022-03-28 MED ORDER — MAGNESIUM HYDROXIDE 400 MG/5ML PO SUSP
960.0000 mL | Freq: Once | ORAL | Status: AC
  Administered 2022-03-28: 960 mL via RECTAL
  Filled 2022-03-28: qty 240

## 2022-03-28 MED ORDER — FENTANYL CITRATE PF 50 MCG/ML IJ SOSY
50.0000 ug | PREFILLED_SYRINGE | Freq: Once | INTRAMUSCULAR | Status: AC
  Administered 2022-03-28: 50 ug via INTRAVENOUS
  Filled 2022-03-28: qty 1

## 2022-03-28 MED ORDER — ONDANSETRON HCL 4 MG/2ML IJ SOLN
4.0000 mg | Freq: Once | INTRAMUSCULAR | Status: AC
  Administered 2022-03-28: 4 mg via INTRAVENOUS
  Filled 2022-03-28: qty 2

## 2022-03-28 NOTE — Discharge Instructions (Signed)
Your CT scan today showed significant constipation.  You have been given an enema to help with your symptoms.  You still likely have some constipation still present, so I recommend that you drink plenty of water and continue taking over-the-counter Dulcolax as directed or MiraLAX, 1 heaping capful mixed in at least 8 to 10 ounces of water or juice 1-2 times daily until your constipation significantly improved.  This may take 1 to 2 days.  Please follow-up with your primary care provider for recheck.  Return to the emergency department for any new or worsening symptoms.  Sitz baths may also help with your symptoms.

## 2022-03-28 NOTE — ED Triage Notes (Signed)
Pt to the ED with complaints of abdominal pain with constipation with no BM for the last 7 days.  Pt has a hemorrhoid that has ruptured and he has been bleeding for the last 3 days.

## 2022-03-29 ENCOUNTER — Ambulatory Visit: Payer: Medicaid Other | Admitting: Gastroenterology

## 2022-03-30 NOTE — ED Provider Notes (Signed)
[facility] EMERGENCY DEPARTMENT Provider Note   CSN: [id number] Arrival date & time: 03/28/22  2355     History  Chief Complaint  Patient presents with   Abdominal Pain    Shawn Griffith is a 31 y.o. male.   Abdominal Pain Associated symptoms: constipation   Associated symptoms: no chest pain, no chills, no dysuria, no fever, no nausea and no vomiting         Shawn Griffith is a 31 y.o. male with past medical history of hypertension, anxiety, obesity and type 2 diabetes who presents to the Emergency Department complaining of diffuse abdominal pain and constipation x7 days.  He also notes having a bleeding hemorrhoid x3 days.  He went underwent a colonoscopy on 03/06/2022 after being evaluated for abdominal pain and rectal bleeding.  He states that he typically has daily loose stools and that constipation has developed since his recent procedure.  He states that he has an urge to defecate but feels as though something is swollen inside his rectum that is causing him to be unable to have a bowel movement.  Have hemorrhoids and states that 1 has recently ruptured.  He has been doing hemorrhoidal cream and compresses with improvement.  Continues to have rectal pain when he attempts to defecate.  He has tried multiple over-the-counter laxatives relief.  Is any fever, chills, vomiting, weakness or dizziness.  No prior abdominal surgeries.   Home Medications Prior to Admission medications   Medication Sig Start Date End Date Taking? Authorizing Provider  cholecalciferol (VITAMIN D3) 25 MCG (1000 UNIT) tablet Take 1,000 Units by mouth daily.   Yes [provider]  hydrocortisone (ANUSOL-HC) 2.5 % rectal cream Place 1 Application rectally 2 (two) times daily. 03/28/22  Yes Ignatius Kloos, PA-C  levothyroxine (SYNTHROID) 25 MCG tablet Take 25 mcg by mouth daily. 02/16/22  Yes [provider]  losartan (COZAAR) 100 MG tablet Take 100 mg by mouth daily. 02/16/22  Yes  [provider]  metFORMIN (GLUCOPHAGE-XR) 500 MG 24 hr tablet Take 500 mg by mouth 2 (two) times daily. 02/16/22  Yes [provider]      Allergies    Patient has no known allergies.    Review of Systems   Review of Systems  Constitutional:  Negative for chills and fever.  Cardiovascular:  Negative for chest pain.  Gastrointestinal:  Positive for abdominal pain and constipation. Negative for nausea and vomiting.  Genitourinary:  Negative for decreased urine volume and dysuria.  Neurological:  Negative for weakness and numbness.    Physical Exam Updated Vital Signs BP (!) 149/92 (BP Location: Right Arm)   Pulse 88   Temp 97.7 F (36.5 C) (Oral)   Resp 19   Ht 6\' 3"  (1.905 m)   Wt (!) 166.5 kg   SpO2 95%   BMI 45.88 kg/m  Physical Exam Vitals and nursing note reviewed. Exam conducted with a chaperone present.  Constitutional:      General: He is not in acute distress.    Appearance: He is obese. He is not toxic-appearing.     Comments: Patient is uncomfortable appearing, nontoxic  HENT:     Mouth/Throat:     Mouth: Mucous membranes are moist.  Cardiovascular:     Rate and Rhythm: Normal rate and regular rhythm.     Pulses: Normal pulses.  Pulmonary:     Effort: Pulmonary effort is normal.  Abdominal:     General: There is no distension.  Palpations: Abdomen is soft.     Tenderness: There is no abdominal tenderness (Diffuse tenderness to the lower abdomen.  No guarding or rebound tenderness.). There is no guarding.  Genitourinary:    Rectum: Guaiac result negative. Tenderness present. No anal fissure or external hemorrhoid. Normal anal tone.     Comments: Brown heme-negative stool on DRE.  Slightly palpable fecal impaction, unable to digitally disimpact due to patient's body habitus.  I do not appreciate any external hemorrhoids or visible anal fissures Musculoskeletal:        General: Normal range of motion.  Skin:    General: Skin is warm.      Capillary Refill: Capillary refill takes less than 2 seconds.  Neurological:     General: No focal deficit present.     Mental Status: He is alert.     Sensory: No sensory deficit.     Motor: No weakness.     ED Results / Procedures / Treatments   Labs (all labs ordered are listed, but only abnormal results are displayed) Labs Reviewed  COMPREHENSIVE METABOLIC PANEL - Abnormal; Notable for the following components:      Result Value   Glucose, Bld 220 (*)    Creatinine, Ser 0.58 (*)    Calcium 8.7 (*)    Albumin 3.4 (*)    All other components within normal limits  CBC WITH DIFFERENTIAL/PLATELET  LIPASE, BLOOD    EKG None  Radiology CT ABDOMEN PELVIS W CONTRAST  Result Date: 03/28/2022 CLINICAL DATA:  Bloody diarrhea for 8 days. EXAM: CT ABDOMEN AND PELVIS WITH CONTRAST TECHNIQUE: Multidetector CT imaging of the abdomen and pelvis was performed using the standard protocol following bolus administration of intravenous contrast. RADIATION DOSE REDUCTION: This exam was performed according to the departmental dose-optimization program which includes automated exposure control, adjustment of the mA and/or kV according to patient size and/or use of iterative reconstruction technique. CONTRAST:  170mL OMNIPAQUE IOHEXOL 300 MG/ML  SOLN COMPARISON:  None Available. FINDINGS: Lower chest: The lung bases are clear of acute process. Minimal left basilar atelectasis. No pleural effusion or pulmonary lesions. The heart is normal in size. No pericardial effusion. The distal esophagus and aorta are unremarkable. Hepatobiliary: No hepatic lesions or intrahepatic biliary dilatation. The gallbladder is unremarkable. No common bile duct dilatation. Pancreas: No mass, inflammation or ductal dilatation. Spleen: Normal size.  No focal lesions. Adrenals/Urinary Tract: Adrenal glands are normal. No renal lesions, renal calculi or hydronephrosis. The bladder is unremarkable. Stomach/Bowel: The stomach, duodenum,  small bowel and colon are grossly normal. No acute inflammatory changes, mass lesions or obstructive findings. The terminal ileum is normal. The appendix is normal. There is a E large amount of stool throughout the colon and down into the rectum suggesting constipation. Vascular/Lymphatic: The aorta is normal in caliber. No dissection. The branch vessels are patent. The major venous structures are patent. No mesenteric or retroperitoneal mass or adenopathy. Small scattered lymph nodes are noted. Reproductive: The prostate gland and seminal vesicles are unremarkable. Other: No pelvic mass or adenopathy. No free pelvic fluid collections. No inguinal mass or adenopathy. Small periumbilical abdominal wall hernia containing fat. Musculoskeletal: No significant bony findings. Moderate age advanced degenerative changes involving the thoracic spine. IMPRESSION: 1. No acute abdominal/pelvic findings, mass lesions or adenopathy. 2. Large amount of stool throughout the colon and down into the rectum suggesting constipation. Electronically Signed   By: Marijo Sanes M.D.   On: 03/28/2022 12:48     Procedures Procedures  Medications Ordered in ED Medications  fentaNYL (SUBLIMAZE) injection 50 mcg (50 mcg Intravenous Given 03/28/22 1138)  ondansetron (ZOFRAN) injection 4 mg (4 mg Intravenous Given 03/28/22 1137)  iohexol (OMNIPAQUE) 300 MG/ML solution 100 mL (100 mLs Intravenous Contrast Given 03/28/22 1218)  sorbitol, milk of mag, mineral oil, glycerin (SMOG) enema (960 mLs Rectal Given 03/28/22 1522)  fentaNYL (SUBLIMAZE) injection 50 mcg (50 mcg Intravenous Given 03/28/22 1545)    ED Course/ Medical Decision Making/ A&P                           Medical Decision Making Patient here with reports of diffuse lower abdominal pain and constipation.  Denies significant bowel movement x7 to 8 days.  Reports constipation began after undergoing colonoscopy on 03/06/2022.  He has used multiple over-the-counter laxatives  without relief.  No history of abdominal surgeries.  On exam, patient uncomfortable appearing, mildly tremulous.  He has some mild tenderness to palpation of the lower abdomen.  Do not appreciate any guarding or rebound tenderness.  His abdomen is soft.  Differential diagnosis would include but not limited to constipation, SBO, rectal abscess, fecal impaction  I suspect patient's abdominal pain is secondary to constipation.  He has a feeling of an obstruction in his rectum, I suspect this is a fecal impaction.  Given patient's recent procedure and level of pain, will obtain labs and imaging.    Amount and/or Complexity of Data Reviewed Labs: ordered.    Details: Labs interpreted by me, no evidence of leukocytosis, chemistries show mild hyperglycemia otherwise unremarkable.  Lipase reassuring. Radiology: ordered.    Details: CT abdomen pelvis was obtained for further evaluation of patient's abdominal pain and rectal symptoms.  CT findings show large stool burden throughout the colon and into the rectum.  No evidence for SBO. Discussion of management or test interpretation with external provider(s): Discussed work-up findings with the patient.  There was a large palpable stool mass on DRE.  We will try smog enema.  Patient agreeable to plan.  Requesting pain medication.  Nursing attempted digital disimpaction with some success.  Limited tolerability to hold enema.  Patient attempting passage of stool burden On recheck, I was notified by patient's spouse at bedside that he passed a large ball of stool.  He has since had significant passage of softer stool and reports feeling better.  He is requesting discharge home.  No indication of emergent process.  I recommended continuation of laxatives and/or stool softeners and increased water intake.  No significant rectal bleeding on exam.  He appears appropriate for discharge home.  All questions were answered.  Risk Prescription drug  management.           Final Clinical Impression(s) / ED Diagnoses Final diagnoses:  Constipation, unspecified constipation type    Rx / DC Orders ED Discharge Orders          Ordered    hydrocortisone (ANUSOL-HC) 2.5 % rectal cream  2 times daily        03/28/22 1619              Smriti Barkow, [place], PA-C 03/30/22 1607    Loetta Rough, MD 03/30/22 2128

## 2022-04-16 NOTE — Progress Notes (Deleted)
GI Office Note    Referring Provider: Donetta Potts, MD Primary Care Physician:  Donetta Potts, MD Primary Gastroenterologist: Hennie Duos. Marletta Lor, DO  Date:  04/16/2022  ID:  Shawn Griffith, DOB [birth year], MRN [id number]  Chief Complaint   No chief complaint on file.   History of Present Illness  Shawn Griffith is a 31 y.o. male with a history of HTN, type 2 diabetes, anxiety, obesity, and chronic diarrhea*** presenting today for follow-up.  Last seen in the office 02/13/2022.  Reported 13-year history of chronic diarrhea associated with abdominal pain, nausea, intermittent rectal bleeding.  Sometimes having about 10 watery stools per day as well as nocturnal stools and occasional mucus in stool.  Stools were reported as being worse after he eats and has occasional bloating and was daily nausea.  Had Pepto-Bismol for his diarrhea in the past, helps with nausea usually.  Had tried Bentyl, felt like it made his abdominal pain worse.  Also reported a history of Lyme disease.  Denied any abdominal pain regarding his umbilical hernia.  Labs and stool testing performed he was scheduled for colonoscopy.  03/02/2022 stool studies negative for any infectious process, including C. difficile. 03/28/2022 -  no evidence of anemia.  Glucose remains elevated.  Normal LFTs.  Normal lipase.  Celiac serologies, iron panel, CRP, and alpha gal not obtained.  Colonoscopy 03/06/2022: Inadequate preparation of the colon, nonbleeding internal hemorrhoids, biopsies taken with cold forceps from the ascending colon, transverse colon, descending colon.  Biopsies were normal mucosa.  Advise repeat colonoscopy in about 6 months due to poor prep.  ED visit 03/28/2022 with abdominal pain and constipation for 7 days, and noting a bleeding hemorrhoid for 3 days.  Noted that he developed constipation after his colonoscopy.  Reported urge to defecate but felt something swollen in his rectum causing him to be unable  to have a bowel movement and having rectal pain with defecation.  He reported he had a hemorrhoid rupture and had been doing hemorrhoid cream and compresses with improvement.  He stated that he had tried multiple over-the-counter laxatives without relief.  ED physician noted mild tenderness to palpation over the lower abdomen, but abdomen was soft.  He had CT A/P noting large stool burden throughout the colon and into the rectum, negative for small bowel obstruction.  He had digital rectal exam noting a large palpable stool mass and he was given a smog enema and attempt at digital disimpaction at the bedside.  After ministration enema he passed a large stool ball and then had significant passage of softer stool thereafter.  He was discharged home with advice to continue laxatives and/or stool softeners and increase water intake.  There was no rectal bleeding noted on exam.  Today:   Current Outpatient Medications  Medication Sig Dispense Refill   cholecalciferol (VITAMIN D3) 25 MCG (1000 UNIT) tablet Take 1,000 Units by mouth daily.     hydrocortisone (ANUSOL-HC) 2.5 % rectal cream Place 1 Application rectally 2 (two) times daily. 30 g 0   levothyroxine (SYNTHROID) 25 MCG tablet Take 25 mcg by mouth daily.     losartan (COZAAR) 100 MG tablet Take 100 mg by mouth daily.     metFORMIN (GLUCOPHAGE-XR) 500 MG 24 hr tablet Take 500 mg by mouth 2 (two) times daily.     No current facility-administered medications for this visit.    Past Medical History:  Diagnosis Date   Anxiety    Complication of anesthesia  very combative after anesthesia   Diabetes ([place])    Hypertension    Hypothyroidism    Ruptured ear drum    right    Past Surgical History:  Procedure Laterality Date   BIOPSY  03/06/2022   Procedure: BIOPSY;  Surgeon: Eloise Harman, DO;  Location: [facility];  Service: Endoscopy;;   COLONOSCOPY WITH PROPOFOL N/A 03/06/2022   Procedure: COLONOSCOPY WITH PROPOFOL;  Surgeon:  Eloise Harman, DO;  Location: [facility];  Service: Endoscopy;  Laterality: N/A;  10:00am   DENTAL SURGERY      Family History  Problem Relation Age of Onset   Diverticulitis Mother    Bowel Disease Father        resection and hernia repair    Allergies as of 04/17/2022   (No Known Allergies)    Social History   Socioeconomic History   Marital status: Married    Spouse name: Not on file   Number of children: Not on file   Years of education: Not on file   Highest education level: Not on file  Occupational History   Not on file  Tobacco Use   Smoking status: Every Day    Packs/day: 0.50    Years: 15.00    Total pack years: 7.50    Types: Cigarettes   Smokeless tobacco: Never  Vaping Use   Vaping Use: Never used  Substance and Sexual Activity   Alcohol use: No   Drug use: Yes    Types: Marijuana   Sexual activity: Not on file  Other Topics Concern   Not on file  Social History Narrative   Not on file   Social Determinants of Health   Financial Resource Strain: Not on file  Food Insecurity: Not on file  Transportation Needs: Not on file  Physical Activity: Not on file  Stress: Not on file  Social Connections: Not on file     Review of Systems   Gen: Denies fever, chills, anorexia. Denies fatigue, weakness, weight loss.  CV: Denies chest pain, palpitations, syncope, peripheral edema, and claudication. Resp: Denies dyspnea at rest, cough, wheezing, coughing up blood, and pleurisy. GI: See HPI Derm: Denies rash, itching, dry skin Psych: Denies depression, anxiety, memory loss, confusion. No homicidal or suicidal ideation.  Heme: Denies bruising, bleeding, and enlarged lymph nodes.   Physical Exam   There were no vitals taken for this visit.  General:   Alert and oriented. No distress noted. Pleasant and cooperative.  Head:  Normocephalic and atraumatic. Eyes:  Conjuctiva clear without scleral icterus. Mouth:  Oral mucosa pink and moist. Good  dentition. No lesions. Lungs:  Clear to auscultation bilaterally. No wheezes, rales, or rhonchi. No distress.  Heart:  S1, S2 present without murmurs appreciated.  Abdomen:  +BS, soft, non-tender and non-distended. No rebound or guarding. No HSM or masses noted. Rectal: *** Msk:  Symmetrical without gross deformities. Normal posture. Extremities:  Without edema. Neurologic:  Alert and  oriented x4 Psych:  Alert and cooperative. Normal mood and affect.   Assessment  Shawn Griffith is a 31 y.o. male with a history of HTN, type 2 diabetes, obesity, anxiety, and chronic diarrhea*** presenting today for follow-up.  Chronic diarrhea/abdominal pain: 13-year history of chronic diarrhea with close to 10 watery stools per day associated with nausea and abdominal pain.  Recently been having intermittent rectal bleeding once or twice a week as well as nocturnal stools.  Pepto-Bismol is helpful for nausea, but not his  diarrhea.  He tried Bentyl in the past which made his abdominal pain worse.  Recent colonoscopy with inadequate prep despite lavage.  Random colonic biopsies were negative for microscopic colitis.  Advised to repeat colonoscopy in 6 months.  Recent labs with no evidence of anemia.  LFTs have been normal.  GI pathogen panel and C. difficile negative.  TSH previously elevated, indicating hypothyroidism.  CRP, alpha-gal testing, and celiac labs have not been collected as previously recommended.   Since colonoscopy, patient has experienced constipation with abdominal pain requiring ED visit.  CT at that time revealed large amount of stool throughout the colon and into the rectum.  He underwent a smog enema and manual disimpaction with good result and was discharged home with instructions to continue laxatives.  PLAN   *** Have celiac serologies, CRP, and alpha gal panel drawn. ??? Recall for repeat TCS in February. Avoidance of dairy products.??    Venetia Night, MSN, FNP-BC,  AGACNP-BC [facility]

## 2022-04-17 ENCOUNTER — Ambulatory Visit: Payer: Medicaid Other | Admitting: Gastroenterology

## 2022-04-19 ENCOUNTER — Other Ambulatory Visit (HOSPITAL_BASED_OUTPATIENT_CLINIC_OR_DEPARTMENT_OTHER): Payer: Self-pay

## 2022-04-19 DIAGNOSIS — R5383 Other fatigue: Secondary | ICD-10-CM

## 2022-04-19 DIAGNOSIS — R0683 Snoring: Secondary | ICD-10-CM

## 2022-04-19 DIAGNOSIS — G4710 Hypersomnia, unspecified: Secondary | ICD-10-CM

## 2022-05-08 ENCOUNTER — Ambulatory Visit: Payer: Medicaid Other | Admitting: Nutrition

## 2022-05-15 ENCOUNTER — Ambulatory Visit: Payer: Medicaid Other | Admitting: Nutrition

## 2022-07-06 ENCOUNTER — Encounter: Payer: Self-pay | Admitting: Internal Medicine

## 2023-03-14 ENCOUNTER — Encounter: Payer: Self-pay | Admitting: Orthopedic Surgery

## 2023-03-14 ENCOUNTER — Ambulatory Visit: Payer: Medicaid Other | Admitting: Orthopedic Surgery

## 2023-03-14 ENCOUNTER — Other Ambulatory Visit (INDEPENDENT_AMBULATORY_CARE_PROVIDER_SITE_OTHER): Payer: Medicaid Other

## 2023-03-14 VITALS — BP 134/90 | HR 71 | Ht 75.0 in | Wt 368.0 lb

## 2023-03-14 DIAGNOSIS — M722 Plantar fascial fibromatosis: Secondary | ICD-10-CM

## 2023-03-14 DIAGNOSIS — S93492A Sprain of other ligament of left ankle, initial encounter: Secondary | ICD-10-CM

## 2023-03-14 DIAGNOSIS — M25572 Pain in left ankle and joints of left foot: Secondary | ICD-10-CM | POA: Diagnosis not present

## 2023-03-14 NOTE — Patient Instructions (Signed)
Instructions Following Joint Injections  In clinic today, you received an injection in one of your joints (sometimes more than one).  Occasionally, you can have some pain at the injection site, this is normal.  You can place ice at the injection site, or take over-the-counter medications such as Tylenol (acetaminophen) or Advil (ibuprofen).  Please follow all directions listed on the bottle.  If your joint (knee or shoulder) becomes swollen, red or very painful, please contact the clinic for additional assistance.   Two medications were injected, including lidocaine and a steroid (often referred to as cortisone).  Lidocaine is effective almost immediately but wears off quickly.  However, the steroid can take a few days to improve your symptoms.  In some cases, it can make your pain worse for a couple of days.  Do not be concerned if this happens as it is common.  You can apply ice or take some over-the-counter medications as needed.   Injections in the same joint cannot be repeated for 3 months.  This helps to limit the risk of an infection in the joint.  If you were to develop an infection in your joint, the best treatment option would be surgery.    Instructions  1.  You have sustained an ankle sprain, or similar exercises that can be treated as an ankle sprain.  **These exercises can also be used as part of recovery from an ankle fracture.  2.  I encourage you to stay on your feet and gradually remove your walking boot.   3.  Below are some exercises that you can complete on your own to improve your symptoms.  4.  As an alternative, you can search for ankle sprain exercises online, and can see some demonstrations on YouTube  5.  If you are having difficulty with these exercises, we can also prescribe formal physical therapy  Ankle Exercises Ask your health care provider which exercises are safe for you. Do exercises exactly as told by your health care provider and adjust them as directed. It  is normal to feel mild stretching, pulling, tightness, or mild discomfort as you do these exercises. Stop right away if you feel sudden pain or your pain gets worse. Do not begin these exercises until told by your health care provider.  Stretching and range-of-motion exercises These exercises warm up your muscles and joints and improve the movement and flexibility of your ankle. These exercises may also help to relieve pain.  Dorsiflexion/plantar flexion  Sit with your L knee straight or bent. Do not rest your foot on anything. Flex your left ankle to tilt the top of your foot toward your shin. This is called dorsiflexion. Hold this position for 5 seconds. Point your toes downward to tilt the top of your foot away from your shin. This is called plantar flexion. Hold this position for 5 seconds. Repeat 10 times. Complete this exercise 2-3 times a day.  As tolerated  Ankle alphabet  Sit with your L foot supported at your lower leg. Do not rest your foot on anything. Make sure your foot has room to move freely. Think of your L foot as a paintbrush: Move your foot to trace each letter of the alphabet in the air. Keep your hip and knee still while you trace the letters. Trace every letter from A to Z. Make the letters as large as you can without causing or increasing any discomfort.  Repeat 2-3 times. Complete this exercise 2-3 times a day.  Strengthening exercises These exercises build strength and endurance in your ankle. Endurance is the ability to use your muscles for a long time, even after they get tired. Dorsiflexors These are muscles that lift your foot up. Secure a rubber exercise band or tube to an object, such as a table leg, that will stay still when the band is pulled. Secure the other end around your L foot. Sit on the floor, facing the object with your L leg extended. The band or tube should be slightly tense when your foot is relaxed. Slowly flex your L ankle and toes to  bring your foot toward your shin. Hold this position for 5 seconds. Slowly return your foot to the starting position, controlling the band as you do that. Repeat 10 times. Complete this exercise 2-3 times a day.  Plantar flexors These are muscles that push your foot down. Sit on the floor with your L leg extended. Loop a rubber exercise band or tube around the ball of your L foot. The ball of your foot is on the walking surface, right under your toes. The band or tube should be slightly tense when your foot is relaxed. Slowly point your toes downward, pushing them away from you. Hold this position for 5 seconds. Slowly release the tension in the band or tube, controlling smoothly until your foot is back in the starting position. Repeat 10 times. Complete this exercise 2-3 times a day.  Towel curls  Sit in a chair on a non-carpeted surface, and put your feet on the floor. Place a towel in front of your feet. Keeping your heel on the floor, put your L foot on the towel. Pull the towel toward you by grabbing the towel with your toes and curling them under. Keep your heel on the floor. Let your toes relax. Grab the towel again. Keep pulling the towel until it is completely underneath your foot. Repeat 10 times. Complete this exercise 2-3 times a day.  Standing plantar flexion This is an exercise in which you use your toes to lift your body's weight while standing. Stand with your feet shoulder-width apart. Keep your weight spread evenly over the width of your feet while you rise up on your toes. Use a wall or table to steady yourself if needed, but try not to use it for support. If this exercise is too easy, try these options: Shift your weight toward your L leg until you feel challenged. If told by your health care provider, lift your uninjured leg off the floor. Hold this position for 5 seconds. Repeat 10 times. Complete this exercise 2-3 times a day.  Tandem walking Stand with one  foot directly in front of the other. Slowly raise your back foot up, lifting your heel before your toes, and place it directly in front of your other foot. Continue to walk in this heel-to-toe way. Have a countertop or wall nearby to use if needed to keep your balance, but try not to hold onto anything for support.  Repeat 10 times. Complete this exercise 2-3 times a day.   Plantar Fasciitis Rehab  Ask your health care provider which exercises are safe for you. Do exercises exactly as told by your health care provider and adjust them as directed. It is normal to feel mild stretching, pulling, tightness, or discomfort as you do these exercises. Stop right away if you feel sudden pain or your pain gets worse. Do not begin these exercises until told by your health care  provider.   Stretching and range-of-motion exercises These exercises warm up your muscles and joints and improve the movement and flexibility of your foot. These exercises also help to relieve pain.   Plantar fascia stretch  Sit with your left / right leg crossed over your opposite knee. Hold your heel with one hand with that thumb near your arch. With your other hand, hold your toes and gently pull them back toward the top of your foot. You should feel a stretch on the base (bottom) of your toes, or the bottom of your foot (plantar fascia), or both. Hold this stretch 10 seconds. Slowly release your toes and return to the starting position. Repeat 10 times. Complete this exercise 1-2 times a day.   Gastrocnemius stretch, standing This exercise is also called a calf (gastroc) stretch. It stretches the muscles in the back of the upper calf. Stand with your hands against a wall. Extend your left / right leg behind you, and bend your front knee slightly. Keeping your heels on the floor, your toes facing forward, and your back knee straight, shift your weight toward the wall. Do not arch your back. You should feel a gentle stretch  in your upper calf. Hold this position for 10 seconds. Repeat 10 times. Complete this exercise 1-2 times a day.   Soleus stretch, standing This exercise is also called a calf (soleus) stretch. It stretches the muscles in the back of the lower calf. Stand with your hands against a wall. Extend your left / right leg behind you, and bend your front knee slightly. Keeping your heels on the floor and your toes facing forward, bend your back knee and shift your weight slightly over your back leg. You should feel a gentle stretch deep in your lower calf. Hold this position for 10 seconds. Repeat 10 times. Complete this exercise 1-2 times a day.   Gastroc and soleus stretch, standing step This exercise stretches the muscles in the back of the lower leg. These muscles are in the upper calf (gastrocnemius) and the lower calf (soleus). Stand with the ball of your left / right foot on the front of a step. The ball of your foot is on the walking surface, right under your toes. Keep your other foot firmly on the same step. Hold on to the wall or a railing for balance. Slowly lift your other foot, allowing your body weight to press your heel down over the edge of the front of the step. Keep knee straight and unbent. You should feel a stretch in your calf. Hold this position for 10 seconds. Return both feet to the step. Repeat this exercise with a slight bend in your left / right knee. Repeat 10 times with your left / right knee straight and 10 times with your left / right knee bent. Complete this exercise 1-2 times a day.   Balance exercise This exercise builds your balance and strength control of your arch to help take pressure off your plantar fascia.   Single leg stand If this exercise is too easy, you can try it with your eyes closed or while standing on a pillow. Without shoes, stand near a railing or in a doorway. You may hold on to the railing or door frame as needed. Stand on your left /  right foot. Keep your big toe down on the floor and lift the arch of your foot. You should feel a stretch across the bottom of your foot and your arch. Do  not let your foot roll inward. Hold this position for 10 seconds. Repeat 10 times. Complete this exercise 1-2 times a day. This information is not intended to replace advice given to you by your health care provider. Make sure you discuss any questions you have with your health care provider.

## 2023-03-14 NOTE — Progress Notes (Signed)
New Patient Visit  Assessment: Shawn Griffith is a 32 y.o. male with the following: 1. Sprain of anterior talofibular ligament of left ankle, initial encounter 2. Plantar fasciitis of left foot   Plan: Shawn Griffith has pain in the left foot and ankle.  Radiographs of the ankle, as well as prior x-rays of the foot without acute injury.  He recently twisted his ankle, and the pain he is having in the anterolateral ankle is most likely a sprain.  The pain, tenderness and history of the heel pain are all consistent with plantar fasciitis.  We discussed multiple treatment options.  On physical exam, he does have tightness of the heel cord, which is likely contributing to his symptoms.  At this point, I offered him an injection, and he has elected to proceed.  I have also provided him with home exercises for both his ankle as well as his foot.  Will follow-up in clinic as needed.  Procedure note injection - Left plantar heel  Verbal consent was obtained to inject the Left plantar heel Timeout was completed to confirm the site of injection.  The skin was prepped with alcohol and ethyl chloride was sprayed at the injection site.  A 21-gauge needle was used to inject 40 mg of Depo-Medrol and 1% lidocaine (1 cc) into the Left heel using a medial approach.  There were no complications. Patient tolerated the procedure well. A sterile bandage was applied   Follow-up: Return if symptoms worsen or fail to improve.  Subjective:  Chief Complaint  Patient presents with   left foot pain    Acute onset of heel pain; rolled ankle since urgent care visit    History of Present Illness: Shawn Griffith is a 32 y.o. male who presents for evaluation of left foot pain.  He is a Naval architect for [employer], making deliveries.  A couple weeks ago, he started to have severe pain in the left heel.  He attributes this to getting in and out of the truck many times per day.  He presented to an urgent care if facility  as his pain continued to worsen, and radiographs demonstrated a heel spur.  He continued to have pain.  More recently, he rolled his ankle, primarily because of the pain.  He states that ibuprofen will help with the symptoms.  No prior injuries to the left foot or the ankle.  He states the first step in the morning is excruciating.  It does start to ease off as he is on his feet.  However, towards the end of the day, he continues to have severe pain.   Review of Systems: No fevers or chills No numbness or tingling No chest pain No shortness of breath No bowel or bladder dysfunction No GI distress No headaches   Medical History:  Past Medical History:  Diagnosis Date   Anxiety    Complication of anesthesia    very combative after anesthesia   Diabetes (HCC)    Hypertension    Hypothyroidism    Ruptured ear drum    right    Past Surgical History:  Procedure Laterality Date   BIOPSY  03/06/2022   Procedure: BIOPSY;  Surgeon: Lanelle Bal, DO;  Location: [facility];  Service: Endoscopy;;   COLONOSCOPY WITH PROPOFOL N/A 03/06/2022   Procedure: COLONOSCOPY WITH PROPOFOL;  Surgeon: Lanelle Bal, DO;  Location: [facility];  Service: Endoscopy;  Laterality: N/A;  10:00am   DENTAL SURGERY  Family History  Problem Relation Age of Onset   Diverticulitis Mother    Bowel Disease Father        resection and hernia repair   Social History   Tobacco Use   Smoking status: Every Day    Current packs/day: 0.50    Average packs/day: 0.5 packs/day for 15.0 years (7.5 ttl pk-yrs)    Types: Cigarettes   Smokeless tobacco: Never  Vaping Use   Vaping status: Never Used  Substance Use Topics   Alcohol use: No   Drug use: Yes    Types: Marijuana    No Known Allergies  Current Meds  Medication Sig   cholecalciferol (VITAMIN D3) 25 MCG (1000 UNIT) tablet Take 1,000 Units by mouth daily.   hydrocortisone (ANUSOL-HC) 2.5 % rectal cream Place 1 Application rectally 2  (two) times daily.   levothyroxine (SYNTHROID) 25 MCG tablet Take 25 mcg by mouth daily.   losartan (COZAAR) 100 MG tablet Take 100 mg by mouth daily.   metFORMIN (GLUCOPHAGE-XR) 500 MG 24 hr tablet Take 500 mg by mouth 2 (two) times daily.    Objective: BP (!) 134/90   Pulse 71   Ht 6\' 3"  (1.905 m)   Wt (!) 368 lb (166.9 kg)   BMI 46.00 kg/m   Physical Exam:  General: Alert and oriented. and No acute distress. Gait: Left sided antalgic gait.  Evaluation of left foot demonstrates no swelling.  No bruising.  Some pain with inversion of the left ankle.  Exquisite tenderness to palpation in the plantar heel.  He can get to a plantigrade position, but has very little motion otherwise.  He demonstrates tightness of the heel cord.  Toes are warm and well-perfused.  IMAGING: I personally ordered and reviewed the following images  X-rays of the left ankle were obtained in clinic today.  No acute injuries are noted.  Mortise is congruent.  No syndesmotic disruption.  No avulsion fractures are appreciated.  No bony lesions.  Impression: Negative left ankle x-ray   Prior x-rays of the left foot were obtained.  There is a small plantar heel spur.  No other injuries are noted.   New Medications:  No orders of the defined types were placed in this encounter.     Oliver Barre, MD  03/14/2023 3:00 PM

## 2023-05-28 ENCOUNTER — Ambulatory Visit (INDEPENDENT_AMBULATORY_CARE_PROVIDER_SITE_OTHER): Payer: Medicaid Other | Admitting: Podiatry

## 2023-05-28 DIAGNOSIS — Z91199 Patient's noncompliance with other medical treatment and regimen due to unspecified reason: Secondary | ICD-10-CM

## 2023-05-28 NOTE — Progress Notes (Signed)
No show
